# Patient Record
Sex: Female | Born: 1970 | Race: White | Hispanic: No | Smoking: Former smoker
Health system: Southern US, Community
[De-identification: ages and names within clinical notes are randomized; demographics above are authoritative.]

## PROBLEM LIST (undated history)

## (undated) DIAGNOSIS — IMO0002 Reserved for concepts with insufficient information to code with codable children: Secondary | ICD-10-CM

## (undated) DIAGNOSIS — F32A Depression, unspecified: Secondary | ICD-10-CM

## (undated) DIAGNOSIS — J45909 Unspecified asthma, uncomplicated: Secondary | ICD-10-CM

## (undated) DIAGNOSIS — F419 Anxiety disorder, unspecified: Secondary | ICD-10-CM

## (undated) DIAGNOSIS — D649 Anemia, unspecified: Secondary | ICD-10-CM

## (undated) DIAGNOSIS — K219 Gastro-esophageal reflux disease without esophagitis: Secondary | ICD-10-CM

## (undated) DIAGNOSIS — K589 Irritable bowel syndrome without diarrhea: Secondary | ICD-10-CM

## (undated) DIAGNOSIS — K760 Fatty (change of) liver, not elsewhere classified: Secondary | ICD-10-CM

## (undated) DIAGNOSIS — F329 Major depressive disorder, single episode, unspecified: Secondary | ICD-10-CM

## (undated) HISTORY — PX: PLANTAR FASCIA RELEASE: SHX2239

## (undated) HISTORY — PX: WISDOM TOOTH EXTRACTION: SHX21

## (undated) HISTORY — DX: Fatty (change of) liver, not elsewhere classified: K76.0

## (undated) HISTORY — PX: DILATION AND CURETTAGE OF UTERUS: SHX78

## (undated) HISTORY — DX: Gastro-esophageal reflux disease without esophagitis: K21.9

## (undated) HISTORY — DX: Irritable bowel syndrome, unspecified: K58.9

## (undated) HISTORY — DX: Major depressive disorder, single episode, unspecified: F32.9

## (undated) HISTORY — DX: Reserved for concepts with insufficient information to code with codable children: IMO0002

## (undated) HISTORY — DX: Unspecified asthma, uncomplicated: J45.909

## (undated) HISTORY — DX: Depression, unspecified: F32.A

## (undated) HISTORY — PX: CHOLECYSTECTOMY: SHX55

---

## 2000-05-16 ENCOUNTER — Other Ambulatory Visit (HOSPITAL_COMMUNITY): Admission: RE | Admit: 2000-05-16 | Discharge: 2000-05-28 | Payer: Self-pay | Admitting: Psychiatry

## 2002-09-19 ENCOUNTER — Other Ambulatory Visit: Admission: RE | Admit: 2002-09-19 | Discharge: 2002-09-19 | Payer: Self-pay | Admitting: Obstetrics and Gynecology

## 2003-03-11 ENCOUNTER — Encounter: Admission: RE | Admit: 2003-03-11 | Discharge: 2003-03-11 | Payer: Self-pay

## 2003-04-03 ENCOUNTER — Encounter: Admission: RE | Admit: 2003-04-03 | Discharge: 2003-04-03 | Payer: Self-pay | Admitting: Internal Medicine

## 2003-11-06 ENCOUNTER — Other Ambulatory Visit: Admission: RE | Admit: 2003-11-06 | Discharge: 2003-11-06 | Payer: Self-pay | Admitting: Obstetrics and Gynecology

## 2003-11-18 ENCOUNTER — Ambulatory Visit (HOSPITAL_COMMUNITY): Admission: RE | Admit: 2003-11-18 | Discharge: 2003-11-18 | Payer: Self-pay | Admitting: Obstetrics and Gynecology

## 2005-04-26 ENCOUNTER — Other Ambulatory Visit: Admission: RE | Admit: 2005-04-26 | Discharge: 2005-04-26 | Payer: Self-pay | Admitting: Gynecology

## 2005-12-31 ENCOUNTER — Ambulatory Visit: Payer: Self-pay | Admitting: Critical Care Medicine

## 2005-12-31 ENCOUNTER — Inpatient Hospital Stay (HOSPITAL_COMMUNITY): Admission: AD | Admit: 2005-12-31 | Discharge: 2006-01-05 | Payer: Self-pay | Admitting: Obstetrics and Gynecology

## 2006-01-02 ENCOUNTER — Encounter (INDEPENDENT_AMBULATORY_CARE_PROVIDER_SITE_OTHER): Payer: Self-pay | Admitting: *Deleted

## 2006-01-02 ENCOUNTER — Encounter: Payer: Self-pay | Admitting: Critical Care Medicine

## 2006-01-04 ENCOUNTER — Ambulatory Visit: Payer: Self-pay | Admitting: Internal Medicine

## 2006-01-06 ENCOUNTER — Encounter: Admission: RE | Admit: 2006-01-06 | Discharge: 2006-02-04 | Payer: Self-pay | Admitting: Obstetrics and Gynecology

## 2006-02-05 ENCOUNTER — Encounter: Admission: RE | Admit: 2006-02-05 | Discharge: 2006-03-07 | Payer: Self-pay | Admitting: Obstetrics and Gynecology

## 2006-02-20 ENCOUNTER — Ambulatory Visit: Payer: Self-pay | Admitting: Internal Medicine

## 2006-03-08 ENCOUNTER — Encounter: Admission: RE | Admit: 2006-03-08 | Discharge: 2006-04-06 | Payer: Self-pay | Admitting: Obstetrics and Gynecology

## 2006-03-13 ENCOUNTER — Ambulatory Visit: Payer: Self-pay | Admitting: Internal Medicine

## 2006-04-07 ENCOUNTER — Encounter: Admission: RE | Admit: 2006-04-07 | Discharge: 2006-05-07 | Payer: Self-pay | Admitting: Obstetrics and Gynecology

## 2006-05-08 ENCOUNTER — Encounter: Admission: RE | Admit: 2006-05-08 | Discharge: 2006-06-07 | Payer: Self-pay | Admitting: Obstetrics and Gynecology

## 2006-06-08 ENCOUNTER — Encounter: Admission: RE | Admit: 2006-06-08 | Discharge: 2006-07-06 | Payer: Self-pay | Admitting: Obstetrics and Gynecology

## 2006-07-07 ENCOUNTER — Encounter: Admission: RE | Admit: 2006-07-07 | Discharge: 2006-08-06 | Payer: Self-pay | Admitting: Obstetrics and Gynecology

## 2006-08-07 ENCOUNTER — Encounter: Admission: RE | Admit: 2006-08-07 | Discharge: 2006-09-05 | Payer: Self-pay | Admitting: Obstetrics and Gynecology

## 2006-09-06 ENCOUNTER — Encounter: Admission: RE | Admit: 2006-09-06 | Discharge: 2006-10-06 | Payer: Self-pay | Admitting: Obstetrics and Gynecology

## 2006-10-07 ENCOUNTER — Encounter: Admission: RE | Admit: 2006-10-07 | Discharge: 2006-11-05 | Payer: Self-pay | Admitting: Obstetrics and Gynecology

## 2006-11-06 ENCOUNTER — Encounter: Admission: RE | Admit: 2006-11-06 | Discharge: 2006-12-06 | Payer: Self-pay | Admitting: Obstetrics and Gynecology

## 2006-12-07 ENCOUNTER — Encounter: Admission: RE | Admit: 2006-12-07 | Discharge: 2007-01-06 | Payer: Self-pay | Admitting: Obstetrics and Gynecology

## 2007-01-07 ENCOUNTER — Encounter: Admission: RE | Admit: 2007-01-07 | Discharge: 2007-01-31 | Payer: Self-pay | Admitting: Obstetrics and Gynecology

## 2007-06-10 ENCOUNTER — Emergency Department (HOSPITAL_COMMUNITY): Admission: EM | Admit: 2007-06-10 | Discharge: 2007-06-10 | Payer: Self-pay | Admitting: Emergency Medicine

## 2007-08-20 ENCOUNTER — Ambulatory Visit (HOSPITAL_COMMUNITY): Admission: RE | Admit: 2007-08-20 | Discharge: 2007-08-20 | Payer: Self-pay | Admitting: Gynecology

## 2008-01-28 ENCOUNTER — Telehealth: Payer: Self-pay | Admitting: Internal Medicine

## 2008-02-01 ENCOUNTER — Inpatient Hospital Stay (HOSPITAL_COMMUNITY): Admission: AD | Admit: 2008-02-01 | Discharge: 2008-02-01 | Payer: Self-pay | Admitting: Obstetrics and Gynecology

## 2008-03-12 ENCOUNTER — Encounter: Payer: Self-pay | Admitting: Internal Medicine

## 2008-03-12 DIAGNOSIS — IMO0002 Reserved for concepts with insufficient information to code with codable children: Secondary | ICD-10-CM

## 2008-03-12 DIAGNOSIS — Z8659 Personal history of other mental and behavioral disorders: Secondary | ICD-10-CM

## 2008-03-12 DIAGNOSIS — K219 Gastro-esophageal reflux disease without esophagitis: Secondary | ICD-10-CM | POA: Insufficient documentation

## 2008-03-12 DIAGNOSIS — K760 Fatty (change of) liver, not elsewhere classified: Secondary | ICD-10-CM

## 2008-03-16 ENCOUNTER — Ambulatory Visit: Payer: Self-pay | Admitting: Internal Medicine

## 2008-03-23 ENCOUNTER — Telehealth: Payer: Self-pay | Admitting: Internal Medicine

## 2008-04-15 ENCOUNTER — Ambulatory Visit (HOSPITAL_COMMUNITY): Admission: RE | Admit: 2008-04-15 | Discharge: 2008-04-15 | Payer: Self-pay | Admitting: Internal Medicine

## 2008-04-15 ENCOUNTER — Ambulatory Visit: Payer: Self-pay | Admitting: Internal Medicine

## 2008-04-15 ENCOUNTER — Telehealth: Payer: Self-pay | Admitting: Internal Medicine

## 2008-04-15 DIAGNOSIS — R109 Unspecified abdominal pain: Secondary | ICD-10-CM | POA: Insufficient documentation

## 2008-04-15 LAB — CONVERTED CEMR LAB
ALT: 8 units/L (ref 0–35)
AST: 15 units/L (ref 0–37)
Albumin: 3 g/dL — ABNORMAL LOW (ref 3.5–5.2)
Alkaline Phosphatase: 49 units/L (ref 39–117)
Amylase: 75 units/L (ref 27–131)
BUN: 6 mg/dL (ref 6–23)
Basophils Absolute: 0.1 10*3/uL (ref 0.0–0.1)
Basophils Relative: 0.6 % (ref 0.0–3.0)
CO2: 25 meq/L (ref 19–32)
Calcium: 8.6 mg/dL (ref 8.4–10.5)
Chloride: 106 meq/L (ref 96–112)
Creatinine, Ser: 0.5 mg/dL (ref 0.4–1.2)
Eosinophils Absolute: 0.1 10*3/uL (ref 0.0–0.7)
Eosinophils Relative: 1.3 % (ref 0.0–5.0)
GFR calc Af Amer: 180 mL/min
GFR calc non Af Amer: 148 mL/min
Glucose, Bld: 83 mg/dL (ref 70–99)
HCT: 32.5 % — ABNORMAL LOW (ref 36.0–46.0)
Hemoglobin: 11.1 g/dL — ABNORMAL LOW (ref 12.0–15.0)
Lipase: 36 units/L (ref 11.0–59.0)
Lymphocytes Relative: 13.8 % (ref 12.0–46.0)
MCHC: 34.7 g/dL (ref 30.0–36.0)
MCV: 84.4 fL (ref 78.0–100.0)
Monocytes Absolute: 0.8 10*3/uL (ref 0.1–1.0)
Monocytes Relative: 7.4 % (ref 3.0–12.0)
Neutro Abs: 8.8 10*3/uL — ABNORMAL HIGH (ref 1.4–7.7)
Neutrophils Relative %: 76.9 % (ref 43.0–77.0)
Platelets: 300 10*3/uL (ref 150–400)
Potassium: 3.7 meq/L (ref 3.5–5.1)
RBC: 3.85 M/uL — ABNORMAL LOW (ref 3.87–5.11)
RDW: 13 % (ref 11.5–14.6)
Sodium: 138 meq/L (ref 135–145)
Total Bilirubin: 0.5 mg/dL (ref 0.3–1.2)
Total Protein: 6.7 g/dL (ref 6.0–8.3)
WBC: 11.4 10*3/uL — ABNORMAL HIGH (ref 4.5–10.5)

## 2008-05-29 ENCOUNTER — Telehealth: Payer: Self-pay | Admitting: Internal Medicine

## 2008-05-29 ENCOUNTER — Encounter: Payer: Self-pay | Admitting: Internal Medicine

## 2008-08-07 ENCOUNTER — Inpatient Hospital Stay (HOSPITAL_COMMUNITY): Admission: AD | Admit: 2008-08-07 | Discharge: 2008-08-11 | Payer: Self-pay | Admitting: Obstetrics and Gynecology

## 2008-08-24 ENCOUNTER — Encounter (INDEPENDENT_AMBULATORY_CARE_PROVIDER_SITE_OTHER): Payer: Self-pay | Admitting: *Deleted

## 2008-12-07 ENCOUNTER — Encounter: Payer: Self-pay | Admitting: Internal Medicine

## 2009-02-12 ENCOUNTER — Ambulatory Visit: Payer: Self-pay | Admitting: Internal Medicine

## 2010-03-18 ENCOUNTER — Telehealth: Payer: Self-pay | Admitting: Internal Medicine

## 2010-05-03 ENCOUNTER — Ambulatory Visit: Admit: 2010-05-03 | Payer: Self-pay | Admitting: Internal Medicine

## 2010-05-06 ENCOUNTER — Encounter: Payer: Self-pay | Admitting: Nurse Practitioner

## 2010-05-10 ENCOUNTER — Ambulatory Visit
Admission: RE | Admit: 2010-05-10 | Discharge: 2010-05-10 | Payer: Self-pay | Source: Home / Self Care | Attending: Gastroenterology | Admitting: Gastroenterology

## 2010-05-10 ENCOUNTER — Encounter: Payer: Self-pay | Admitting: Internal Medicine

## 2010-05-10 ENCOUNTER — Telehealth: Payer: Self-pay | Admitting: Internal Medicine

## 2010-05-10 ENCOUNTER — Other Ambulatory Visit: Payer: Self-pay | Admitting: Nurse Practitioner

## 2010-05-10 DIAGNOSIS — R1013 Epigastric pain: Secondary | ICD-10-CM | POA: Insufficient documentation

## 2010-05-10 DIAGNOSIS — R112 Nausea with vomiting, unspecified: Secondary | ICD-10-CM | POA: Insufficient documentation

## 2010-05-10 LAB — CBC WITH DIFFERENTIAL/PLATELET
Basophils Absolute: 0.1 10*3/uL (ref 0.0–0.1)
Basophils Relative: 1.7 % (ref 0.0–3.0)
Eosinophils Absolute: 0.3 10*3/uL (ref 0.0–0.7)
Eosinophils Relative: 4.8 % (ref 0.0–5.0)
HCT: 36.5 % (ref 36.0–46.0)
Hemoglobin: 12.5 g/dL (ref 12.0–15.0)
Lymphocytes Relative: 31.5 % (ref 12.0–46.0)
Lymphs Abs: 2 10*3/uL (ref 0.7–4.0)
MCHC: 34.2 g/dL (ref 30.0–36.0)
MCV: 82.7 fl (ref 78.0–100.0)
Monocytes Absolute: 0.5 10*3/uL (ref 0.1–1.0)
Monocytes Relative: 8 % (ref 3.0–12.0)
Neutro Abs: 3.4 10*3/uL (ref 1.4–7.7)
Neutrophils Relative %: 54 % (ref 43.0–77.0)
Platelets: 310 10*3/uL (ref 150.0–400.0)
RBC: 4.41 Mil/uL (ref 3.87–5.11)
RDW: 14.2 % (ref 11.5–14.6)
WBC: 6.3 10*3/uL (ref 4.5–10.5)

## 2010-05-10 LAB — LIPASE: Lipase: 31 U/L (ref 11.0–59.0)

## 2010-05-10 LAB — COMPREHENSIVE METABOLIC PANEL
ALT: 8 U/L (ref 0–35)
AST: 15 U/L (ref 0–37)
Albumin: 3.7 g/dL (ref 3.5–5.2)
Alkaline Phosphatase: 60 U/L (ref 39–117)
BUN: 10 mg/dL (ref 6–23)
CO2: 30 mEq/L (ref 19–32)
Calcium: 9 mg/dL (ref 8.4–10.5)
Chloride: 105 mEq/L (ref 96–112)
Creatinine, Ser: 1 mg/dL (ref 0.4–1.2)
GFR: 69.06 mL/min (ref >60.00)
Glucose, Bld: 77 mg/dL (ref 70–99)
Potassium: 4.7 mEq/L (ref 3.5–5.1)
Sodium: 140 mEq/L (ref 135–145)
Total Bilirubin: 0.4 mg/dL (ref 0.3–1.2)
Total Protein: 7.3 g/dL (ref 6.0–8.3)

## 2010-05-10 LAB — AMYLASE: Amylase: 38 U/L (ref 27–131)

## 2010-05-11 ENCOUNTER — Telehealth: Payer: Self-pay | Admitting: Internal Medicine

## 2010-05-12 ENCOUNTER — Telehealth: Payer: Self-pay | Admitting: Internal Medicine

## 2010-05-12 ENCOUNTER — Emergency Department (HOSPITAL_COMMUNITY)
Admission: EM | Admit: 2010-05-12 | Discharge: 2010-05-13 | Payer: Self-pay | Source: Home / Self Care | Admitting: Emergency Medicine

## 2010-05-13 ENCOUNTER — Encounter (INDEPENDENT_AMBULATORY_CARE_PROVIDER_SITE_OTHER): Payer: Self-pay | Admitting: *Deleted

## 2010-05-13 ENCOUNTER — Telehealth: Payer: Self-pay | Admitting: Internal Medicine

## 2010-05-16 ENCOUNTER — Ambulatory Visit: Admit: 2010-05-16 | Payer: Self-pay | Admitting: Internal Medicine

## 2010-05-16 LAB — COMPREHENSIVE METABOLIC PANEL
ALT: 9 U/L (ref 0–35)
AST: 18 U/L (ref 0–37)
Albumin: 3.6 g/dL (ref 3.5–5.2)
Alkaline Phosphatase: 63 U/L (ref 39–117)
BUN: 9 mg/dL (ref 6–23)
CO2: 27 mEq/L (ref 19–32)
Calcium: 8.9 mg/dL (ref 8.4–10.5)
Chloride: 105 mEq/L (ref 96–112)
Creatinine, Ser: 1 mg/dL (ref 0.4–1.2)
GFR calc Af Amer: >60 mL/min (ref >60)
GFR calc non Af Amer: >60 mL/min (ref >60)
Glucose, Bld: 97 mg/dL (ref 70–99)
Potassium: 3.5 mEq/L (ref 3.5–5.1)
Sodium: 137 mEq/L (ref 135–145)
Total Bilirubin: 0.3 mg/dL (ref 0.3–1.2)
Total Protein: 7 g/dL (ref 6.0–8.3)

## 2010-05-16 LAB — DIFFERENTIAL
Basophils Absolute: 0 K/uL (ref 0.0–0.1)
Basophils Relative: 0 % (ref 0–1)
Eosinophils Absolute: 0.3 K/uL (ref 0.0–0.7)
Eosinophils Relative: 4 % (ref 0–5)
Lymphocytes Relative: 33 % (ref 12–46)
Lymphs Abs: 2.6 K/uL (ref 0.7–4.0)
Monocytes Absolute: 0.6 K/uL (ref 0.1–1.0)
Monocytes Relative: 8 % (ref 3–12)
Neutro Abs: 4.3 K/uL (ref 1.7–7.7)
Neutrophils Relative %: 55 % (ref 43–77)

## 2010-05-16 LAB — CBC
HCT: 36.2 % (ref 36.0–46.0)
Hemoglobin: 11.6 g/dL — ABNORMAL LOW (ref 12.0–15.0)
MCH: 27.2 pg (ref 26.0–34.0)
MCHC: 32 g/dL (ref 30.0–36.0)
MCV: 84.8 fL (ref 78.0–100.0)
Platelets: 280 10*3/uL (ref 150–400)
RBC: 4.27 MIL/uL (ref 3.87–5.11)
RDW: 13.5 % (ref 11.5–15.5)
WBC: 7.8 10*3/uL (ref 4.0–10.5)

## 2010-05-16 LAB — URINALYSIS, ROUTINE W REFLEX MICROSCOPIC
Bilirubin Urine: NEGATIVE
Ketones, ur: NEGATIVE mg/dL
Leukocytes, UA: NEGATIVE
Nitrite: NEGATIVE
Protein, ur: NEGATIVE mg/dL
Specific Gravity, Urine: 1.016 (ref 1.005–1.030)
Urine Glucose, Fasting: NEGATIVE mg/dL
Urobilinogen, UA: 0.2 mg/dL (ref 0.0–1.0)
pH: 6 (ref 5.0–8.0)

## 2010-05-16 LAB — URINE MICROSCOPIC-ADD ON

## 2010-05-16 LAB — POCT PREGNANCY, URINE: Preg Test, Ur: NEGATIVE

## 2010-05-16 LAB — LIPASE, BLOOD: Lipase: 28 U/L (ref 11–59)

## 2010-05-17 ENCOUNTER — Other Ambulatory Visit: Payer: Self-pay | Admitting: Internal Medicine

## 2010-05-17 ENCOUNTER — Ambulatory Visit
Admission: RE | Admit: 2010-05-17 | Discharge: 2010-05-17 | Payer: Self-pay | Source: Home / Self Care | Attending: Internal Medicine | Admitting: Internal Medicine

## 2010-05-17 ENCOUNTER — Encounter: Payer: Self-pay | Admitting: Internal Medicine

## 2010-05-17 DIAGNOSIS — R197 Diarrhea, unspecified: Secondary | ICD-10-CM | POA: Insufficient documentation

## 2010-05-17 LAB — HEPATIC FUNCTION PANEL
ALT: 11 U/L (ref 0–35)
AST: 15 U/L (ref 0–37)
Albumin: 3.8 g/dL (ref 3.5–5.2)
Alkaline Phosphatase: 64 U/L (ref 39–117)
Bilirubin, Direct: 0 mg/dL (ref 0.0–0.3)
Total Bilirubin: 0.3 mg/dL (ref 0.3–1.2)
Total Protein: 7.2 g/dL (ref 6.0–8.3)

## 2010-05-17 LAB — LIPASE: Lipase: 22 U/L (ref 11.0–59.0)

## 2010-05-19 ENCOUNTER — Ambulatory Visit: Admit: 2010-05-19 | Payer: Self-pay | Admitting: Internal Medicine

## 2010-05-20 LAB — AMYLASE: Amylase: 32 U/L (ref 27–131)

## 2010-05-22 ENCOUNTER — Encounter: Payer: Self-pay | Admitting: Obstetrics and Gynecology

## 2010-05-23 ENCOUNTER — Emergency Department (HOSPITAL_COMMUNITY)
Admission: EM | Admit: 2010-05-23 | Discharge: 2010-05-24 | Payer: Self-pay | Source: Home / Self Care | Admitting: Emergency Medicine

## 2010-05-23 ENCOUNTER — Telehealth: Payer: Self-pay | Admitting: Internal Medicine

## 2010-05-23 ENCOUNTER — Ambulatory Visit (HOSPITAL_COMMUNITY)
Admission: RE | Admit: 2010-05-23 | Discharge: 2010-05-23 | Payer: Self-pay | Source: Home / Self Care | Attending: Internal Medicine | Admitting: Internal Medicine

## 2010-05-24 ENCOUNTER — Encounter (INDEPENDENT_AMBULATORY_CARE_PROVIDER_SITE_OTHER): Payer: Self-pay | Admitting: *Deleted

## 2010-05-25 LAB — CBC
HCT: 35.8 % — ABNORMAL LOW (ref 36.0–46.0)
Hemoglobin: 11.3 g/dL — ABNORMAL LOW (ref 12.0–15.0)
MCH: 26.8 pg (ref 26.0–34.0)
MCHC: 31.6 g/dL (ref 30.0–36.0)
MCV: 84.8 fL (ref 78.0–100.0)
Platelets: 281 10*3/uL (ref 150–400)
RBC: 4.22 MIL/uL (ref 3.87–5.11)
RDW: 13.7 % (ref 11.5–15.5)
WBC: 7.1 10*3/uL (ref 4.0–10.5)

## 2010-05-25 LAB — DIFFERENTIAL
Basophils Absolute: 0 10*3/uL (ref 0.0–0.1)
Basophils Relative: 1 % (ref 0–1)
Eosinophils Absolute: 0.3 10*3/uL (ref 0.0–0.7)
Eosinophils Relative: 5 % (ref 0–5)
Lymphocytes Relative: 38 % (ref 12–46)
Lymphs Abs: 2.7 10*3/uL (ref 0.7–4.0)
Monocytes Absolute: 0.6 10*3/uL (ref 0.1–1.0)
Monocytes Relative: 8 % (ref 3–12)
Neutro Abs: 3.5 10*3/uL (ref 1.7–7.7)
Neutrophils Relative %: 49 % (ref 43–77)

## 2010-05-25 LAB — COMPREHENSIVE METABOLIC PANEL
ALT: <8 U/L (ref 0–35)
AST: 16 U/L (ref 0–37)
Albumin: 3.4 g/dL — ABNORMAL LOW (ref 3.5–5.2)
Alkaline Phosphatase: 53 U/L (ref 39–117)
BUN: 11 mg/dL (ref 6–23)
CO2: 24 mEq/L (ref 19–32)
Calcium: 8.6 mg/dL (ref 8.4–10.5)
Chloride: 109 mEq/L (ref 96–112)
Creatinine, Ser: 0.93 mg/dL (ref 0.4–1.2)
GFR calc Af Amer: >60 mL/min (ref >60)
GFR calc non Af Amer: >60 mL/min (ref >60)
Glucose, Bld: 99 mg/dL (ref 70–99)
Potassium: 3.9 mEq/L (ref 3.5–5.1)
Sodium: 141 mEq/L (ref 135–145)
Total Bilirubin: 0.1 mg/dL — ABNORMAL LOW (ref 0.3–1.2)
Total Protein: 6.5 g/dL (ref 6.0–8.3)

## 2010-05-25 LAB — LIPASE, BLOOD: Lipase: 32 U/L (ref 11–59)

## 2010-05-27 ENCOUNTER — Encounter: Payer: Self-pay | Admitting: Internal Medicine

## 2010-05-27 ENCOUNTER — Ambulatory Visit
Admission: RE | Admit: 2010-05-27 | Discharge: 2010-05-27 | Payer: Self-pay | Source: Home / Self Care | Attending: Internal Medicine | Admitting: Internal Medicine

## 2010-05-29 ENCOUNTER — Telehealth: Payer: Self-pay | Admitting: Internal Medicine

## 2010-05-29 ENCOUNTER — Inpatient Hospital Stay (HOSPITAL_COMMUNITY)
Admission: EM | Admit: 2010-05-29 | Discharge: 2010-06-05 | DRG: 493 | Disposition: A | Discharge status: Home / Self Care | Payer: BC Managed Care – PPO | Attending: Internal Medicine | Admitting: Internal Medicine

## 2010-05-29 DIAGNOSIS — K219 Gastro-esophageal reflux disease without esophagitis: Secondary | ICD-10-CM | POA: Diagnosis present

## 2010-05-29 DIAGNOSIS — F341 Dysthymic disorder: Secondary | ICD-10-CM | POA: Diagnosis present

## 2010-05-29 DIAGNOSIS — K811 Chronic cholecystitis: Principal | ICD-10-CM | POA: Diagnosis present

## 2010-05-29 DIAGNOSIS — K5900 Constipation, unspecified: Secondary | ICD-10-CM | POA: Diagnosis present

## 2010-05-29 DIAGNOSIS — K7689 Other specified diseases of liver: Secondary | ICD-10-CM | POA: Diagnosis present

## 2010-05-29 DIAGNOSIS — Z87891 Personal history of nicotine dependence: Secondary | ICD-10-CM

## 2010-05-29 DIAGNOSIS — E860 Dehydration: Secondary | ICD-10-CM | POA: Diagnosis present

## 2010-05-29 DIAGNOSIS — E785 Hyperlipidemia, unspecified: Secondary | ICD-10-CM | POA: Diagnosis present

## 2010-05-29 LAB — CBC
HCT: 35.3 % — ABNORMAL LOW (ref 36.0–46.0)
Hemoglobin: 11.1 g/dL — ABNORMAL LOW (ref 12.0–15.0)
MCH: 26.6 pg (ref 26.0–34.0)
MCHC: 31.4 g/dL (ref 30.0–36.0)
MCV: 84.7 fL (ref 78.0–100.0)
Platelets: 271 10*3/uL (ref 150–400)
RBC: 4.17 MIL/uL (ref 3.87–5.11)
RDW: 13.3 % (ref 11.5–15.5)
WBC: 8.5 10*3/uL (ref 4.0–10.5)

## 2010-05-29 LAB — COMPREHENSIVE METABOLIC PANEL
ALT: 8 U/L (ref 0–35)
AST: 14 U/L (ref 0–37)
Albumin: 3.4 g/dL — ABNORMAL LOW (ref 3.5–5.2)
Alkaline Phosphatase: 51 U/L (ref 39–117)
BUN: 7 mg/dL (ref 6–23)
CO2: 25 mEq/L (ref 19–32)
Calcium: 8.3 mg/dL — ABNORMAL LOW (ref 8.4–10.5)
Chloride: 107 mEq/L (ref 96–112)
Creatinine, Ser: 0.94 mg/dL (ref 0.4–1.2)
GFR calc Af Amer: >60 mL/min (ref >60)
GFR calc non Af Amer: >60 mL/min (ref >60)
Glucose, Bld: 90 mg/dL (ref 70–99)
Potassium: 4 mEq/L (ref 3.5–5.1)
Sodium: 140 mEq/L (ref 135–145)
Total Bilirubin: 0.3 mg/dL (ref 0.3–1.2)
Total Protein: 6.5 g/dL (ref 6.0–8.3)

## 2010-05-29 LAB — URINE MICROSCOPIC-ADD ON

## 2010-05-29 LAB — URINALYSIS, ROUTINE W REFLEX MICROSCOPIC
Bilirubin Urine: NEGATIVE
Ketones, ur: NEGATIVE mg/dL
Leukocytes, UA: NEGATIVE
Nitrite: NEGATIVE
Protein, ur: NEGATIVE mg/dL
Specific Gravity, Urine: 1.017 (ref 1.005–1.030)
Urine Glucose, Fasting: NEGATIVE mg/dL
Urobilinogen, UA: 0.2 mg/dL (ref 0.0–1.0)
pH: 7 (ref 5.0–8.0)

## 2010-05-29 LAB — DIFFERENTIAL
Basophils Absolute: 0 10*3/uL (ref 0.0–0.1)
Basophils Relative: 0 % (ref 0–1)
Eosinophils Absolute: 0.3 10*3/uL (ref 0.0–0.7)
Eosinophils Relative: 4 % (ref 0–5)
Lymphocytes Relative: 23 % (ref 12–46)
Lymphs Abs: 1.9 10*3/uL (ref 0.7–4.0)
Monocytes Absolute: 0.6 10*3/uL (ref 0.1–1.0)
Monocytes Relative: 7 % (ref 3–12)
Neutro Abs: 5.7 10*3/uL (ref 1.7–7.7)
Neutrophils Relative %: 67 % (ref 43–77)

## 2010-05-29 LAB — LIPASE, BLOOD: Lipase: 37 U/L (ref 11–59)

## 2010-05-29 LAB — POCT PREGNANCY, URINE: Preg Test, Ur: NEGATIVE

## 2010-05-30 ENCOUNTER — Encounter (INDEPENDENT_AMBULATORY_CARE_PROVIDER_SITE_OTHER): Payer: Self-pay | Admitting: *Deleted

## 2010-05-30 ENCOUNTER — Telehealth: Payer: Self-pay | Admitting: Internal Medicine

## 2010-05-30 LAB — COMPREHENSIVE METABOLIC PANEL
Alkaline Phosphatase: 43 U/L (ref 39–117)
BUN: 7 mg/dL (ref 6–23)
Creatinine, Ser: 0.99 mg/dL (ref 0.4–1.2)
Glucose, Bld: 94 mg/dL (ref 70–99)
Potassium: 3.7 mEq/L (ref 3.5–5.1)
Total Protein: 6 g/dL (ref 6.0–8.3)

## 2010-05-30 LAB — LIPID PANEL
Cholesterol: 219 mg/dL — ABNORMAL HIGH (ref 0–200)
LDL Cholesterol: 150 mg/dL — ABNORMAL HIGH (ref 0–99)
Total CHOL/HDL Ratio: 6 RATIO
Total CHOL/HDL Ratio: 5.9 RATIO
VLDL: 27 mg/dL (ref 0–40)

## 2010-05-30 LAB — CBC
HCT: 31.7 % — ABNORMAL LOW (ref 36.0–46.0)
MCH: 26.5 pg (ref 26.0–34.0)
MCHC: 31.5 g/dL (ref 30.0–36.0)
MCV: 84.1 fL (ref 78.0–100.0)
RDW: 13.4 % (ref 11.5–15.5)
WBC: 6.4 10*3/uL (ref 4.0–10.5)

## 2010-05-30 LAB — RAPID URINE DRUG SCREEN, HOSP PERFORMED
Barbiturates: POSITIVE — AB
Cocaine: NOT DETECTED
Opiates: POSITIVE — AB

## 2010-05-31 DIAGNOSIS — R1013 Epigastric pain: Secondary | ICD-10-CM

## 2010-05-31 NOTE — Progress Notes (Signed)
Summary: Nexium refill  Medications Added NEXIUM 40 MG CPDR (ESOMEPRAZOLE MAGNESIUM) Take 1 tablet by mouth two times a day       Phone Note Call from Patient   Caller: Pt walked in Call For: CMA Summary of Call: Pt walked in and wanted to know if she could have a refill of her Nexium. Pt states she takes Nexium one tablet by mouth once daily unless she is having a really bad reflux day and she has to take it two times a day. Pt did schedule a appt for 05/03/10 to see Dr. Juanda Chance b/c she is due for her follow- up appt. Pt was given samples of Nexium today and one refill until her appt. Told pt take the Nexium one capsule by mouth twice a day like Dr. Juanda Chance prescribed.  Pt informed in the future to call the office for further refills and questions about her medicines instead of walking in the clinic and pt verbalized understanding.  Initial call taken by: Christie Nottingham CMA (AAMA),  March 18, 2010 10:04 AM    New/Updated Medications: NEXIUM 40 MG CPDR (ESOMEPRAZOLE MAGNESIUM) Take 1 tablet by mouth two times a day Prescriptions: NEXIUM 40 MG CPDR (ESOMEPRAZOLE MAGNESIUM) Take 1 tablet by mouth two times a day  #60 x 0   Entered by:   Christie Nottingham CMA (AAMA)   Authorized by:   Hart Carwin MD   Signed by:   Christie Nottingham CMA (AAMA) on 03/18/2010   Method used:   Electronically to        [facility]* (retail)       [address]       [address] Shipping/mailing       [place], [place]  [address]       Ph: [phone]       Fax: [phone]   RxID:   [id number]

## 2010-06-01 ENCOUNTER — Encounter: Payer: Self-pay | Admitting: Internal Medicine

## 2010-06-01 ENCOUNTER — Encounter: Payer: Self-pay | Admitting: Gastroenterology

## 2010-06-01 DIAGNOSIS — R109 Unspecified abdominal pain: Secondary | ICD-10-CM

## 2010-06-02 DIAGNOSIS — R1013 Epigastric pain: Secondary | ICD-10-CM
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
## 2010-06-02 NOTE — Progress Notes (Signed)
Summary: Triage   Phone Note Call from Patient Call back at Work Phone [phone] Call back at [phone]   Caller: Patient Call For: Dr. Juanda Chance Reason for Call: Talk to Nurse Summary of Call: abd pain, vomiting, bloated.Marland KitchenMarland KitchenWent to PCP on Friday and was told it could possibly be her gallbladder. Requesting sooner appt. than 05-25-10 Initial call taken by: Karna Christmas,  May 10, 2010 10:14 AM  Follow-up for Phone Call        Patient states that starting last Wednesday, she had nausea, vomiting, abdominal pain and bloating. She went to her PCP on Friday. PCP wasn't sure if she had a virus or this was her gallbladder. PCP gave her Phenergan. Continues to have nausea and has vomiting if she doesn't take Phenergan. Drinking fluids but unable to eat. Pain is upper center of stomach above belly button. Feels bloated. Has appointment on 05/25/10 for annual f/u but wants to come in to be seen for above issues. Scheduled patient with Willette Cluster, RNP today at 1:30 PM. Follow-up by: Jesse Fall RN,  May 10, 2010 10:50 AM  Additional Follow-up for Phone Call Additional follow up Details #1::        reviewed and agree. Additional Follow-up by: Hart Carwin MD,  May 10, 2010 7:59 PM

## 2010-06-02 NOTE — Miscellaneous (Signed)
Summary: Librax Rx  Clinical Lists Changes  Medications: Added new medication of LIBRAX 2.5-5 MG  CAPS (CLIDINIUM-CHLORDIAZEPOXIDE) Take one tablet twice a day. - Signed Rx of LIBRAX 2.5-5 MG  CAPS (CLIDINIUM-CHLORDIAZEPOXIDE) Take one tablet twice a day.;  #30 x 0;  Signed;  Entered by: Durwin Glaze RN;  Authorized by: Hart Carwin MD;  Method used: [facility] [address] [phone]*, [address] [place], [place], [place]  [address], Ph: [phone] or [phone], Fax: [phone]    Prescriptions: LIBRAX 2.5-5 MG  CAPS (CLIDINIUM-CHLORDIAZEPOXIDE) Take one tablet twice a day.  #30 x 0   Entered by:   Durwin Glaze RN   Authorized by:   Hart Carwin MD   Signed by:   Durwin Glaze RN on 05/17/2010   Method used:   [facility]        [facility]  [address] [phone]* (retail)       [address]       [place], [place]  [address]       Ph: [phone] or [phone]       Fax: [phone]   RxID:   [id number]

## 2010-06-02 NOTE — Progress Notes (Signed)
Summary: triage / abdominal pain   Phone Note Call from Patient Call back at [phone]   Caller: Patient Call For: Dr Lynder Parents Reason for Call: Talk to Nurse Summary of Call: Patient states that she was seen yesterday for stomach pain but today her stomach pain is worse, wants to know if she can be given something for pain. Initial call taken by: Tawni Levy,  May 11, 2010 1:58 PM  Follow-up for Phone Call        [facility] yesterday. Pain is worse today in upper part of stomach. Pain is in center of chest below breasts. Patient describes the pain as sharp, stabbing pain. Yesterday the pain was more general. Taking Butalbital-Apap every 4 hours not to exceed 6/day as prescribed by PCP. Denies vomiting. Has nausea without vomiting. Diarrhea x 1 today. took Imodium with relief. Taking Nexium two times a day and has EGD scheduled for 05/24/10. She wants something for pain. Please, advise. Follow-up by: Jesse Fall RN,  May 11, 2010 3:42 PM  Additional Follow-up for Phone Call Additional follow up Details #1::        review of chart suggests that the patient has had this problem recurrently without obvious cause found. Currently on b.i.d. PPI. Plans for endoscopy noted. Recent ER visit noted. Cannot recommend any change in plans over the phone based on my review of record. If her symptoms were to come incapacitating, she should probably go to the ER for reevaluation. Otherwise, you can check with Dr. Juanda Chance in the morning, when she returns. Additional Follow-up by: Hilarie Fredrickson MD,  May 11, 2010 4:42 PM    Additional Follow-up for Phone Call Additional follow up Details #2::    Patient aware of Dr. Lamar Sprinkles recommendations. Told her I will have Dr. Juanda Chance review in AM also.  Follow-up by: Jesse Fall RN,  May 11, 2010 4:45 PM  Additional Follow-up for Phone Call Additional follow up Details #3:: Details for Additional Follow-up Action Taken: Please set up an  appointment. Additional Follow-up by: Hart Carwin MD,  May 11, 2010 8:29 PM   Appended Document: triage / abdominal pain Appointment with Dr. Juanda Chance scheduled on 05/16/10 9:30 AM. patient aware.

## 2010-06-02 NOTE — Letter (Signed)
Summary: [facility] Instructions  [facility]  [address] [place], [place] [address]   Phone: [phone]  Fax: [phone]       Sabrina Welch    06/11/1970    MRN: [id number]        Procedure Day /Date: 06/07/10 Tuesday     Arrival Time: 8:00 am     Procedure Time: 9:00 am     Location of Procedure:                    _ x_  [facility] ( Outpatient Registration)                      PREPARATION FOR COLONOSCOPY WITH MOVIPREP   Starting 5 days prior to your procedure 06/02/10 do not eat nuts, seeds, popcorn, corn, beans, peas,  salads, or any raw vegetables.  Do not take any fiber supplements (e.g. Metamucil, Citrucel, and Benefiber).  THE DAY BEFORE YOUR PROCEDURE         DATE: 06/06/10  DAY: Monday  1.  Drink clear liquids the entire day-NO SOLID FOOD  2.  Do not drink anything colored red or purple.  Avoid juices with pulp.  No orange juice.  3.  Drink at least 64 oz. (8 glasses) of fluid/clear liquids during the day to prevent dehydration and help the prep work efficiently.  CLEAR LIQUIDS INCLUDE: Water Jello Ice Popsicles Tea (sugar ok, no milk/cream) Powdered fruit flavored drinks Coffee (sugar ok, no milk/cream) Gatorade Juice: apple, white grape, white cranberry  Lemonade Clear bullion, consomm, broth Carbonated beverages (any kind) Strained chicken noodle soup Hard Candy                             4.  In the morning, mix first dose of MoviPrep solution:    Empty 1 Pouch A and 1 Pouch B into the disposable container    Add lukewarm drinking water to the top line of the container. Mix to dissolve    Refrigerate (mixed solution should be used within 24 hrs)  5.  Begin drinking the prep at 5:00 p.m. The MoviPrep container is divided by 4 marks.   Every 15 minutes drink the solution down to the next mark (approximately 8 oz) until the full liter is complete.   6.  Follow completed prep with 16 oz of clear liquid of your choice  (Nothing red or purple).  Continue to drink clear liquids until bedtime.  7.  Before going to bed, mix second dose of MoviPrep solution:    Empty 1 Pouch A and 1 Pouch B into the disposable container    Add lukewarm drinking water to the top line of the container. Mix to dissolve    Refrigerate  THE DAY OF YOUR PROCEDURE      DATE: 06/07/10 DAY: Tuesday  Beginning at 4:00 a.m. (5 hours before procedure):         1. Every 15 minutes, drink the solution down to the next mark (approx 8 oz) until the full liter is complete.  2. Follow completed prep with 16 oz. of clear liquid of your choice.    3. You may drink clear liquids until 5:00 am (4 HOURS BEFORE PROCEDURE).   MEDICATION INSTRUCTIONS  Unless otherwise instructed, you should take regular prescription medications with a small sip of water   as early as possible the morning of your  procedure.       OTHER INSTRUCTIONS  You will need a responsible adult at least 39 years of age to accompany you and drive you home.   This person must remain in the waiting room during your procedure.  Wear loose fitting clothing that is easily removed.  Leave jewelry and other valuables at home.  However, you may wish to bring a book to read or  an iPod/MP3 player to listen to music as you wait for your procedure to start.  Remove all body piercing jewelry and leave at home.  Total time from sign-in until discharge is approximately 2-3 hours.  You should go home directly after your procedure and rest.  You can resume normal activities the  day after your procedure.  The day of your procedure you should not:   Drive   Make legal decisions   Operate machinery   Drink alcohol   Return to work  You will receive specific instructions about eating, activities and medications before you leave.    The above instructions have been reviewed and explained to me by   _______________________    I fully understand and can verbalize  these instructions _____________________________ Date _________

## 2010-06-02 NOTE — Progress Notes (Signed)
Summary: triage  Medications Added PROMETHAZINE HCL 25 MG TABS (PROMETHAZINE HCL) Take one by mouth every 4-6 hours as needed nausea, vomiting DONNATAL  TABS (BELLADONNA ALK-PHENOBARBITAL) Take one by mouth three times a day [facility])       Phone Note Call from Patient Call back at Home Phone [phone] Call back at [phone]   Caller: Patient Call For: Dr Juanda Chance Reason for Call: Talk to Nurse Summary of Call: Patient states that she had Hida Scan thia am and still has a lot of pain, vomitting and she's not sure of what to do. Initial call taken by: Tawni Levy,  May 23, 2010 1:32 PM  Follow-up for Phone Call        Spoke with patient. She states she had the HIDA scan this AM. She is still having the abdominal pain and is having to take Vicodan. Elio Forget is not helping. . She is using the Librax two times a day also. States she is having vomiting also and she is out of the Phenergan 25 mg tabs that the ER gave her. Do you want to order more Phenergan? Schedule f/u? Please, advise.  Follow-up by: Jesse Fall RN,  May 23, 2010 3:03 PM  Additional Follow-up for Phone Call Additional follow up Details #1::        please refill Phenergan 25mg , #30, 1 by mouth q 44-6 hrs, 1 refill. Please schedule CT scan of the abd and Pelvis oral and IV contrast. Additional Follow-up by: Hart Carwin MD,  May 23, 2010 5:49 PM    Additional Follow-up for Phone Call Additional follow up Details #2::    Spoke with patient. After she spoke with Dr. Juanda Chance last PM, her pain got worse and she went to the ER. She had A CT scan of abd, pelvis(copied to EMR) which was normal. She received IVF, IV pain med and IV nausea meds. She was told to contact Dr. Juanda Chance for further evaluation. Rx for Phenergan sent to patients pharmacy. Please, advise for further instructions. Follow-up by: Jesse Fall RN,  May 24, 2010 8:32 AM  Additional Follow-up for Phone Call Additional follow up Details  #3:: Details for Additional Follow-up Action Taken: please give her a trial of Donnatal 1 by mouth three times a day ( ac),, #60, 1 refill and set up an appointment. her CT scan was normal. Additional Follow-up by: Hart Carwin MD,  May 24, 2010 8:35 AM  New/Updated Medications: PROMETHAZINE HCL 25 MG TABS (PROMETHAZINE HCL) Take one by mouth every 4-6 hours as needed nausea, vomiting DONNATAL  TABS (BELLADONNA ALK-PHENOBARBITAL) Take one by mouth three times a day [facility]) Prescriptions: DONNATAL  TABS (BELLADONNA ALK-PHENOBARBITAL) Take one by mouth three times a day (AC)  #60 x 1   Entered by:   Jesse Fall RN   Authorized by:   Hart Carwin MD   Signed by:   Jesse Fall RN on 05/24/2010   Method used:   [facility]        [facility] [address] [phone]* (retail)       [address]       [place], [place]  [address]       Ph: [phone] or [phone]       Fax: [phone]   RxID:   [id number] PROMETHAZINE HCL 25 MG TABS (PROMETHAZINE HCL) Take one by mouth every 4-6 hours as needed nausea, vomiting  #30 x 1  Entered by:   Jesse Fall RN   Authorized by:   Hart Carwin MD   Signed by:   Jesse Fall RN on 05/24/2010   Method used:   Electronically to        [facility] [phone]* (retail)       [address]       [place], [place]  [address]       Ph: [phone] or [phone]       Fax: [phone]   RxID:   [id number]   Appended Document: triage Spoke with patient. Rx for Donnatal sent to pharmacy. She will stop Librax. Appointment given for 05/27/10 2:45 PM with Dr. Juanda Chance.

## 2010-06-02 NOTE — Progress Notes (Signed)
Summary: Still having pain  Medications Added VICODIN 5-500 MG TABS (HYDROCODONE-ACETAMINOPHEN) Take one tablet every 4-6 hours as needed pain       Phone Note Call from Patient Call back at [phone]    Call For: Dr Juanda Chance Summary of Call: Still waiting for call back. Still having pain Initial call taken by: Leanor Kail [facility],  May 12, 2010 1:05 PM  Follow-up for Phone Call        Spoke with patient. She is having increased abdominal pain. Sharp,stabbing pain in upper part of stomach. Per Mike Gip, PA Vicodan 5/500 1 tab every 4-6 hours as needed pain and schedule upper abdominal ultrasound tomorrow. Keep Monday appointment with Dr. Juanda Chance. Patient can go to ER if pain is unbearable. Ultrasound scheduled with Felicia at [facility] 05/13/10 at [facility]. NPO 6 hours prior. Patient scheduled with Dr. Juanda Chance on 05/16/10 at 9:30 AM. Rx for Vicodan to pharmacy.    New/Updated Medications: VICODIN 5-500 MG TABS (HYDROCODONE-ACETAMINOPHEN) Take one tablet every 4-6 hours as needed pain Prescriptions: VICODIN 5-500 MG TABS (HYDROCODONE-ACETAMINOPHEN) Take one tablet every 4-6 hours as needed pain  #30 x 0   Entered by:   Jesse Fall RN   Authorized by:   Sammuel Cooper PA-c   Signed by:   Jesse Fall RN on 05/12/2010   Method used:   Printed then faxed to ...       [facility]* (retail)       [address]       [address] Shipping/mailing       [place], [place]  [address]       Ph: [phone]       Fax: [phone]   RxID:   [id number]   Appended Document: Still having pain Patient notified of appointments and Amy Esterwood's recommendations.   Clinical Lists Changes  Orders: Added new Test order of Ultrasound Abdomen (UAS) - Signed

## 2010-06-02 NOTE — Letter (Signed)
Summary: EGD Instructions  [facility]  [address] [place], [place] [address]   Phone: [phone]  Fax: [phone]       Sabrina Welch    1970/10/15    MRN: [id number]       Procedure Day /Date: 05-19-2010     Arrival Time: 3:00 PM      Procedure Time: 4:00 PM      Location of Procedure:                    X     [facility] (4th Floor)  PREPARATION FOR ENDOSCOPY   On 05-19-2010  THE DAY OF THE PROCEDURE:  1.   No solid foods, milk or milk products are allowed after midnight the night before your procedure.  2.   Do not drink anything colored red or purple.  Avoid juices with pulp.  No orange juice.  3.  You may drink clear liquids until 2:00 PM , which is 2 hours before your procedure.                                                                                                CLEAR LIQUIDS INCLUDE: Water Jello Ice Popsicles Tea (sugar ok, no milk/cream) Powdered fruit flavored drinks Coffee (sugar ok, no milk/cream) Gatorade Juice: apple, white grape, white cranberry  Lemonade Clear bullion, consomm, broth Carbonated beverages (any kind) Strained chicken noodle soup Hard Candy   MEDICATION INSTRUCTIONS  Unless otherwise instructed, you should take regular prescription medications with a small sip of water as early as possible the morning of your procedure.        OTHER INSTRUCTIONS  You will need a responsible adult at least 40 years of age to accompany you and drive you home.   This person must remain in the waiting room during your procedure.  Wear loose fitting clothing that is easily removed.  Leave jewelry and other valuables at home.  However, you may wish to bring a book to read or an iPod/MP3 player to listen to music as you wait for your procedure to start.  Remove all body piercing jewelry and leave at home.  Total time from sign-in until discharge is approximately 2-3 hours.  You should go home directly after  your procedure and rest.  You can resume normal activities the day after your procedure.  The day of your procedure you should not:   Drive   Make legal decisions   Operate machinery   Drink alcohol   Return to work  You will receive specific instructions about eating, activities and medications before you leave.    The above instructions have been reviewed and explained to me by   _______________________    I fully understand and can verbalize these instructions _____________________________ Date _________

## 2010-06-02 NOTE — Procedures (Addendum)
Summary: Upper Endoscopy  Patient: [name] Note: All result statuses are Final unless otherwise noted.  Tests: (1) Upper Endoscopy (EGD)   EGD Upper Endoscopy       DONE     [facility]     [address]     [place], [place]  [address]           ENDOSCOPY PROCEDURE REPORT           PATIENT:  Sabrina Welch, Sabrina Welch  MR#:  [id number]     BIRTHDATE:  [birth year], 39 yrs. old  GENDER:  female           ENDOSCOPIST:  Hedwig Morton. Juanda Chance, MD     Referred by:  Kirby Funk, M.D.           PROCEDURE DATE:  05/17/2010     PROCEDURE:  EGD with biopsy, [address]     ASA CLASS:  Class I     INDICATIONS:  nausea and vomiting, abdominal pain hx of acute     fatty liver of pregnancy vs biliary disease 12/2005, recurrent pain     2009, Thickened gall bladder on sono 2007 but not in 2009 or 2012.     Lipase elevated in 2007, but not last week.           MEDICATIONS:   Versed 10 mg, Fentanyl 75 mcg     TOPICAL ANESTHETIC:  Exactacain Spray           DESCRIPTION OF PROCEDURE:   After the risks benefits and     alternatives of the procedure were thoroughly explained, informed     consent was obtained.  The LB GIF-H180 [id number] endoscope was     introduced through the mouth and advanced to the second portion of     the duodenum, without limitations.  The instrument was slowly     withdrawn as the mucosa was fully examined.     <<PROCEDUREIMAGES>>           The upper, middle, and distal third of the esophagus were     carefully inspected and no abnormalities were noted. The z-line     was well seen at the GEJ. The endoscope was pushed into the fundus     which was normal including a retroflexed view. The antrum,gastric     body, first and second part of the duodenum were unremarkable.     With standard forceps, a biopsy was obtained and sent to pathology     (see image1, image2, image3, image4, and image5). small bowl     biopsy because of microcytic anemia    Retroflexed views revealed     no  abnormalities.    The scope was then withdrawn from the patient     and the procedure completed.           COMPLICATIONS:  None           ENDOSCOPIC IMPRESSION:     1) Normal EGD     s/p small bowl biopsies     RECOMMENDATIONS:     1) Await biopsy results     HIDA scan with CCK     LFT's, amy, lipase today     Librazx 1 po bid, #30,no refill           REPEAT EXAM:  In 0 year(s) for.           ______________________________     Hedwig Morton. Juanda Chance, MD  CC:  Kirby Funk, M.D.           n.     eSIGNED:   Hedwig Morton. Alette Kataoka at 05/17/2010 09:52 AM           Lottie Rater, [id number]  Note: An exclamation mark (!) indicates a result that was not dispersed into the flowsheet. Document Creation Date: 05/17/2010 9:53 AM _______________________________________________________________________  (1) Order result status: Final Collection or observation date-time: 05/17/2010 09:34 Requested date-time:  Receipt date-time:  Reported date-time:  Referring Physician:   Ordering Physician: Lina Sar [phone]) Specimen Source:  Source: Launa Grill Order Number: [id number] Lab site:   Appended Document: Upper Endoscopy    Clinical Lists Changes  Orders: Added new Referral order of HIDA CCK (HIDA CCK) - Signed      Appended Document: Upper Endoscopy Appointment for HIDA with CCk 05/26/10 10:00 AM at [facility] radiology. NPO after midnight. No pain meds. 12 hours prior. Information gvien to recovery room nurse for patient.

## 2010-06-02 NOTE — Letter (Signed)
Summary: [facility]  [facility]   Imported By: Sherian Rein 05/24/2010 12:17:26  _____________________________________________________________________  External Attachment:    Type:   Image     Comment:   External Document

## 2010-06-02 NOTE — Progress Notes (Signed)
Summary: Pt was in ER all night - 8hrs total   Phone Note Call from Patient Call back at Home Phone [phone]   Call For: Dr Juanda Chance Reason for Call: Talk to Nurse Summary of Call: Micah Flesher to ER at [facility] last night and had some stuff done including an ultrasound. Was told to follow up with Dr Juanda Chance today and see if she should follow up sooner than Monday and if she should have her Endo sooner than the 24th.  Initial call taken by: Leanor Kail [facility],  May 13, 2010 8:50 AM  Follow-up for Phone Call        Spoke with patient. She went to ER last PM.(records in EMR) She had an ultrasound of abd that did not show any "stones" and abd xray. They gave her Dilaudid and Zofran which helped her pain and nausea. She is on clear liquids for 24 hours. She is sleepy this AM but is still having nausea and pain all across her stomach and back. She has Phenegan for nausea and Percocet for pain. She is scheduled to see Dr. Juanda Chance on 05/16/10. EGD scheduled for 05/24/10. Patient wants to know if she needs to be seen today( 9 on our afternoon at this time) and if she needs to move up the EGD. Please, advise Follow-up by: Jesse Fall RN,  May 13, 2010 9:24 AM  Additional Follow-up for Phone Call Additional follow up Details #1::        How about just moving her  for direct EGD for Tue 05/17/2009, since she was already evaluated in ER and it seems like they did not find anything? Additional Follow-up by: Hart Carwin MD,  May 13, 2010 12:32 PM    Additional Follow-up for Phone Call Additional follow up Details #2::    Scheduled patient for 05/17/10 at 9:30 AM for EGD with Dr. Juanda Chance at [facility]. Patient aware. Cancelled EGD for 05/24/10 and MD visit for 05/25/10. Patient aware of appointment. Follow-up by: Jesse Fall RN,  May 13, 2010 1:24 PM

## 2010-06-02 NOTE — Miscellaneous (Signed)
Summary: Orders Update-labs  Clinical Lists Changes  Problems: Added new problem of DIARRHEA (ICD-787.91) Orders: Added new Test order of TLB-Hepatic/Liver Function Pnl (80076-HEPATIC) - Signed Added new Test order of TLB-Lipase (83690-LIPASE) - Signed Added new Test order of T-Aldolase ([address]-60[address]) - Signed

## 2010-06-02 NOTE — Miscellaneous (Signed)
Summary: ER report  Clinical Lists Changes

## 2010-06-03 ENCOUNTER — Inpatient Hospital Stay (HOSPITAL_COMMUNITY): Payer: BC Managed Care – PPO

## 2010-06-03 ENCOUNTER — Other Ambulatory Visit: Payer: Self-pay | Admitting: Surgery

## 2010-06-03 LAB — MRSA PCR SCREENING: MRSA by PCR: NEGATIVE

## 2010-06-05 ENCOUNTER — Encounter: Payer: Self-pay | Admitting: Internal Medicine

## 2010-06-07 ENCOUNTER — Other Ambulatory Visit: Payer: Self-pay | Admitting: Internal Medicine

## 2010-06-08 ENCOUNTER — Encounter: Payer: Self-pay | Admitting: Internal Medicine

## 2010-06-08 ENCOUNTER — Other Ambulatory Visit: Payer: Self-pay | Admitting: Internal Medicine

## 2010-06-08 NOTE — Procedures (Signed)
Summary: Colonoscopy  Patient: [name] Note: All result statuses are Final unless otherwise noted.  Tests: (1) Colonoscopy (COL)   COL Colonoscopy           DONE     [facility]     [address]     [place], [place]  [address]           COLONOSCOPY PROCEDURE REPORT           PATIENT:  Sabrina Welch, Sabrina Welch  MR#:  [id number]     BIRTHDATE:  03-24-1971, 39 yrs. old  GENDER:  female     ENDOSCOPIST:  Barbette Hair. Arlyce Dice, MD     REF. BY:  Kirby Funk, M.D.     PROCEDURE DATE:  06/01/2010     PROCEDURE:  Diagnostic Colonoscopy     ASA CLASS:  Class I     INDICATIONS:  Abdominal pain     MEDICATIONS:   Fentanyl 150 mcg, Versed 15 mg IV, Benadryl 50 mg           DESCRIPTION OF PROCEDURE:   After the risks benefits and     alternatives of the procedure were thoroughly explained, informed     consent was obtained.  Digital rectal exam was performed and     revealed no abnormalities.   The Pentax Colonoscope [id number]     endoscope was introduced through the anus and advanced to the     cecum, which was identified by the ileocecal valve, without     limitations.  The quality of the prep was good, using MoviPrep.     The instrument was then slowly withdrawn as the colon was fully     examined.     <<PROCEDUREIMAGES>>           FINDINGS:  A normal appearing cecum, ileocecal valve, and     appendiceal orifice were identified. The ascending, hepatic     flexure, transverse, splenic flexure, descending, sigmoid colon,     and rectum appeared unremarkable (see image001, image002,     image004, image005, image006, image007, image008, and image009).     Retroflexed views in the rectum revealed no abnormalities.    The     scope was then withdrawn from the patient and the procedure     completed.           COMPLICATIONS:  None     ENDOSCOPIC IMPRESSION:     1) Normal colon     RECOMMENDATIONS:No further GI w/u           REPEAT EXAM:  No        ______________________________     Barbette Hair. Arlyce Dice, MD           CC:           n.     eSIGNED:   Barbette Hair. Kaplan at 06/01/2010 06:10 PM           Lottie Rater, [id number]  Note: An exclamation mark (!) indicates a result that was not dispersed into the flowsheet. Document Creation Date: 06/01/2010 6:10 PM _______________________________________________________________________  (1) Order result status: Final Collection or observation date-time: 06/01/2010 18:05 Requested date-time:  Receipt date-time:  Reported date-time:  Referring Physician:   Ordering Physician: Melvia Heaps [phone]) Specimen Source:  Source: Launa Grill Order Number: [id number] Lab site:
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
## 2010-06-08 NOTE — Progress Notes (Signed)
Summary: ON CALL: nausea, vomiting, and pain   Phone Note Call from Patient   Caller: Patient Call For: Dr Juanda Chance Details for Reason: Vomiting and pain Summary of Call: Patient called c/o ongoing problems w/ vomiting and upper abominal pain. has had for 4 weeks w/ negative evaluations. EMR record reviewed. Was at [facility] yesterday. Dx w/ UTI. Wants to know what to do. I told her that I could not advise over the phone. If she felt that she needed to, then to go to ER for evaluation anr treatment. Could also call office in am for further recommendations from Dr Juanda Chance. Initial call taken by: Hilarie Fredrickson MD,  May 29, 2010 8:18 PM

## 2010-06-08 NOTE — Progress Notes (Signed)
Summary: FYI: [facility] APPT   Phone Note From Other Clinic   Caller: Blanca @ [facility] Call For: DR Juanda Chance Summary of Call: Patient is Sched w- Dr Bertram Savin on 06-06-10 2:40pm pt will need to arrive at 2:20pm Initial call taken by: Leanor Kail [facility],  May 30, 2010 11:02 AM  Follow-up for Phone Call        I have left a message for the patient to call back. Dottie Nelson-Smith CMA Duncan Dull)  May 30, 2010 11:40 AM   Per female, patient is in [place] ER and GI is seeing her. Apparently Dr Arlyce Dice plans to do colonoscopy on patient later on in the week. I will discuss the above information with Mike Gip, PA-C Follow-up by: Lamona Curl CMA Duncan Dull),  May 30, 2010 5:04 PM     Appended Document: FYI: [facility] APPT I have spoken to Mike Gip, PA-C to advise her that patient has appointment with [facility] (patient is currently hospitalized). Amy ask that patient's [facility] appointment be cancelled as well as her colonoscopy as they plan to give patient a colonosocpy and [facility] consult while she is inpatient. Both [facility] and colonoscopy appointments have been cancelled. I have also spoken with Noreene Larsson @ [facility] to cancel colonoscopy on their end. She verbalizes understanding.  Appended Document: FYI: [facility] APPT reviewed and agree

## 2010-06-08 NOTE — Procedures (Signed)
Summary: Instructions for procedure/[place]  Instructions for procedure/[place]   Imported By: Sherian Rein 06/02/2010 14:41:54  _____________________________________________________________________  External Attachment:    Type:   Image     Comment:   External Document

## 2010-06-16 NOTE — Letter (Addendum)
Summary: Patient Notice-Endo Biopsy Results  [facility]  [address] [place], [place] [address]   Phone: [phone]  Fax: [phone]        June 08, 2010 MRN: [id number]    Sabrina Welch [address] [place], [place]  [address]    Dear Ms. Monia Pouch,  I am pleased to inform you that the biopsies taken during your recent endoscopic examination did not show any evidence of cancer upon pathologic examination.  Additional information/recommendations:  __No further action is needed at this time.  Please follow-up with      your primary care physician for your other healthcare needs.  __ Please call [phone] to schedule a return visit to review      your condition.  _x_ Continue with the treatment plan as outlined on the day of your      exam.  You have had Your gall bladder removed recently, please see me as needed   Please call us if you are having persistent problems or have questions about your condition that have not been fully answered at this time.  Sincerely,  Hart Carwin MD  This letter has been electronically signed by your physician.  Appended Document: Patient Notice-Endo Biopsy Results letter mailed to patient's home

## 2010-06-17 ENCOUNTER — Telehealth: Payer: Self-pay | Admitting: Internal Medicine

## 2010-06-17 NOTE — Discharge Summary (Signed)
  Sabrina Welch, PUNCHES NO.:  [id number]  MEDICAL RECORD NO.:  [id number]           PATIENT TYPE:  I  LOCATION:  5006                         FACILITY:  [facility]  PHYSICIAN:  Theressa Millard, M.D.    DATE OF BIRTH:  07/09/1970  DATE OF ADMISSION:  05/29/2010 DATE OF DISCHARGE:  06/05/2010                              DISCHARGE SUMMARY   ADMITTING DIAGNOSIS:  Abdominal pain.  DISCHARGE DIAGNOSES: 1. Chronic cholecystitis, status post cholecystectomy with resolution     of abdominal pain. 2. Fatty infiltration of liver. 3. Gastroesophageal reflux disease. 4. Anxiety disorder. 5. Depression.  The patient is a 40 year old woman who had had abdominal discomfort for about 3 weeks associated with nausea, vomiting, diarrhea.  He had been worked up extensively by Dr. Lina Sar including having ultrasound, HIDA scan, and CT scan in the last 3 weeks.  She was sent to [facility], but they had no idea of what to do for her and so she was referred back to Dr. Valentina Lucks.  He admitted the patient.  HOSPITAL COURSE:  The patient was admitted and underwent further evaluation by both GI and Surgery.  She had also had a colonoscopy done during this admission that was negative.  Dr. Gerrit Friends advised the patient that he was unsure whether the patient's gallbladder was the source of her pain but given no other recourse agreed to have the patient undergo cholecystectomy, but gave her only a 30% chance of having improvement in her pain.  She underwent the procedure on February 3rd.  She had the usual postoperative soreness and discomfort but noticed resolution in the abdominal pain and nausea and vomiting that she had been having for the last 3-4 weeks.  She was discharged in improved condition.  DISCHARGE MEDICATIONS: 1. Ambien 10 mg nightly p.r.n. 2. Carafate 1 gram b.i.d. 3. Donnatal 1 tablet twice daily p.r.n. 4. Lorazepam 0.5 mg t.i.d. p.r.n. 5. Nexium 40 mg b.i.d. 6. NuvaRing  vaginally once monthly. 7. Percocet 5/325 q.4 h. p.r.n. 8. Phenergan 25 mg q.4 h. p.r.n. 9. Pristiq XR 50 mg daily. 10.Wellbutrin SR 200 mg daily. 11.Zofran 4 mg p.r.n.Marland Kitchen  ACTIVITY:  Increase activity slowly.  Return to work June 09, 2010.  FOLLOWUP:  She will make an appointment to see Dr. Valentina Lucks in a couple weeks to talk about tapering off some of the GI medications.     Theressa Millard, M.D.     JO/MEDQ  D:  06/05/2010  T:  06/06/2010  Job:  [id number]  Electronically Signed by Theressa Millard M.D. on 06/17/2010 02:33:25 PM

## 2010-06-22 ENCOUNTER — Other Ambulatory Visit: Payer: BC Managed Care – PPO

## 2010-06-22 ENCOUNTER — Encounter: Payer: Self-pay | Admitting: Physician Assistant

## 2010-06-22 ENCOUNTER — Ambulatory Visit (INDEPENDENT_AMBULATORY_CARE_PROVIDER_SITE_OTHER): Payer: BC Managed Care – PPO | Admitting: Physician Assistant

## 2010-06-22 ENCOUNTER — Encounter (INDEPENDENT_AMBULATORY_CARE_PROVIDER_SITE_OTHER): Payer: Self-pay | Admitting: *Deleted

## 2010-06-22 DIAGNOSIS — R1084 Generalized abdominal pain: Secondary | ICD-10-CM | POA: Insufficient documentation

## 2010-06-22 DIAGNOSIS — F32A Depression, unspecified: Secondary | ICD-10-CM | POA: Insufficient documentation

## 2010-06-22 DIAGNOSIS — F329 Major depressive disorder, single episode, unspecified: Secondary | ICD-10-CM | POA: Insufficient documentation

## 2010-06-22 DIAGNOSIS — R110 Nausea: Secondary | ICD-10-CM | POA: Insufficient documentation

## 2010-06-22 DIAGNOSIS — A09 Infectious gastroenteritis and colitis, unspecified: Secondary | ICD-10-CM

## 2010-06-22 DIAGNOSIS — R197 Diarrhea, unspecified: Secondary | ICD-10-CM

## 2010-06-22 NOTE — Progress Notes (Signed)
Summary: Appt next wk    Phone Note From Other Clinic   Caller: Dr Marga Hoots lunch now will be back at 1pm [phone] Lauren now Call For: Dr Juanda Chance Reason for Call: Schedule Patient Appt Summary of Call: Post hosp fu next wk for complications after gallbladder surgery. Pt having alot of diarrhea Initial call taken by: Leanor Kail [facility],  June 17, 2010 11:59 AM  Follow-up for Phone Call        patient will come see Mike Gip PA on 06/22/10 9:30.  Lauren will send notes.  patient c/o watery diarrrhea and persistent RUQ pain post cholecystectomy. Follow-up by: Darcey Nora RN, CGRN,  June 17, 2010 3:47 PM

## 2010-06-22 NOTE — Consult Note (Signed)
Summary: [place]   Imported By: Sherian Rein 06/16/2010 14:18:39  _____________________________________________________________________  External Attachment:    Type:   Image     Comment:   External Document

## 2010-06-23 ENCOUNTER — Encounter: Payer: Self-pay | Admitting: Physician Assistant

## 2010-06-23 NOTE — Op Note (Signed)
Sabrina Welch, Sabrina Welch                ACCOUNT NO.:  [id number]  MEDICAL RECORD NO.:  [id number]           PATIENT TYPE:  I  LOCATION:  5006                         FACILITY:  [facility]  PHYSICIAN:  Velora Heckler, MD      DATE OF BIRTH:  07-Nov-1970  DATE OF PROCEDURE:  06/03/2010 DATE OF DISCHARGE:                              OPERATIVE REPORT   PREOPERATIVE DIAGNOSIS:  Abdominal pain, nausea and vomiting.  POSTOPERATIVE DIAGNOSIS:  Chronic cholecystitis.  PROCEDURE:  Laparoscopic cholecystectomy with intraoperative cholangiography.  SURGEON:  Velora Heckler, MD  ASSISTANT:  Eber Hong, PA  ANESTHESIA:  General per Dr. Diamantina Monks.  ESTIMATED BLOOD LOSS:  Minimal.  PREPARATION:  ChloraPrep.  COMPLICATIONS:  None.  INDICATIONS:  The patient is a 40 year old white female admitted on the medical service with unrelenting abdominal pain, nausea and vomiting. The patient had an extensive workup by Gastroenterology including upper endoscopy, colonoscopy, CT scan of the abdomen, ultrasound of the abdomen, and nuclear medicine hepatobiliary scanning.  The patient had persistent epigastric and right upper quadrant abdominal pain.  The patient was evaluated by General Surgery and at the request of the patient and Gastroenterology was brought to the operating room for cholecystectomy.  BODY OF REPORT:  Procedure was done in OR #70 at [facility].  The patient was brought to the operating room, placed in a supine position on the operating room table.  Following  administration of general anesthesia, the patient was positioned and then prepped and draped in the usual strict aseptic fashion.  After ascertaining that an adequate level of anesthesia had been achieved, an infraumbilical incision was made with a #15 blade.  Dissection was carried through subcutaneous tissues.  Fascia was incised in the midline and the peritoneal cavity was entered cautiously.   0-Vicryl pursestring suture was placed in the fascia.  An Hasson cannula was introduced under direct vision and secured with a pursestring suture.  Abdomen was insufflated with carbon dioxide.  Laparoscope was introduced and the abdomen explored.  Operative ports were placed along the right costal margin in the midline, midclavicular line, and anterior axillary line.  Fundus of the gallbladder was grasped and retracted cephalad.  There were multiple relatively dense adhesions of the omentum to the undersurface of the gallbladder.  These were gently taken down with blunt dissection and hemostasis achieved with the electrocautery.  Dissection was carried down to the neck of the gallbladder where the peritoneum was incised. Cystic duct and cystic artery were dissected out along their length. Cystic artery was doubly clipped proximally and distally.  A single clip was placed at the neck of the gallbladder to occlude the cystic duct. Cystic duct was incised and clear yellow bile emanates from the cystic duct.  Cystic duct caliber was quite small.  A Cook cholangiography catheter was introduced through a stab wound in the right upper quadrant.  It was inserted into the cystic duct and secured with a Ligaclip.  Using C-arm fluoroscopy, real time cholangiography was performed.  There was rapid filling of a normal caliber biliary tree. There was free  flow distally into the duodenum without filling defect or obstruction.  There was reflux of contrast into the right and left hepatic ductal systems.  Clip was withdrawn and a Cook catheter was removed from the peritoneal cavity.  Cystic duct was triply clipped and divided.  Cystic artery was divided.  Gallbladder was then excised from the gallbladder bed using the hook electrocautery for hemostasis.  Gallbladder was placed into an EndoCatch bag and withdrawn through the umbilical port without difficulty.  On palpation, it does not appear to contain  gallstones.  Right upper quadrant was irrigated with warm saline, which was evacuated.  Good hemostasis was noted.  Pneumoperitoneum was released. Ports were removed under direct vision.  Good hemostasis was noted at all port sites.  All wounds were anesthetized with local anesthetic. All wounds were closed with interrupted 4-0 Monocryl subcuticular sutures.  Wounds were washed and dried and Benzoin and Steri-Strips were applied.  Sterile dressings were applied.  The patient was awakened from anesthesia and brought to the recovery room.  The patient tolerated the procedure well.     Velora Heckler, MD     TMG/MEDQ  D:  06/03/2010  T:  06/04/2010  Job:  [id number]  cc:   Barbette Hair. Arlyce Dice, MD,FACG Hedwig Morton. Juanda Chance, MD Thora Lance, M.D.  Electronically Signed by Darnell Level MD on 06/23/2010 10:33:48 AM

## 2010-06-23 NOTE — Consult Note (Signed)
Sabrina Welch, VO NO.:  [id number]  MEDICAL RECORD NO.:  [id number]          PATIENT TYPE:  INP  LOCATION:  5006                         FACILITY:  [facility]  PHYSICIAN:  Velora Heckler, MD      DATE OF BIRTH:  02-03-1971  DATE OF CONSULTATION:  06/01/2010 DATE OF DISCHARGE:                                CONSULTATION   REQUESTING PHYSICIAN:  Dr. Arlyce Dice.  PRIMARY CARE PHYSICIAN:  Dr. Kirby Funk.  CHIEF COMPLAINT/REASON FOR CONSULTATION:  Abdominal pain of uncertain etiology, questionable cholecystitis.  BRIEF HISTORY:  The patient is a 40 year old white female who reports abdominal pain, which has been ongoing in constant since May 04, 2010.  She says the pain is only relieved with meds.  She complains of nausea and vomiting with pain.  She says the pain is in the midepigastric and in fact points towards the right upper quadrant, although this does not seem to be the only site of discomfort. She says food makes her feel full.  She reports what she thinks is an approximately 8 pound weight loss.  With the pain, she also complains of bloating.  She has had an extensive workup, she was seen January 6 at Urgent Care.  Then she has been seen at [facility].  She has also been seen at [facility].  Her workup has been quite extensive.  She has had lipase as done on January 10, January 12, January 17, and January 29, all of which were normal.  She has had a two-view x-ray of the abdomen on May 30, 2010, which showed a single loop small bowel dilatation.  She has undergone a HIDA scan January 23, which showed prompt visualization of gallbladder.  EF was 71%, and there was no symptoms with CCK infusion.  She has undergone EGD, which was normal. She has had a CT of the abdomen and pelvis, which was normal.  Her most recent abdominal 2-D view was May 30, 2010.  This was also normal. Abdominal ultrasound May 13, 2010, showed gallbladder with no  stones no thickening or pericholecystic fluid.  The pancreas was also normal and no other abnormality was found.  The patient reports she had some diarrhea after her HIDA scan, has been constipated since that time.  She was hospitalized on May 30, 2010, by Dr. Valentina Lucks with ongoing abdominal pain, which she says is only relieved with medication for pain.  She also reports nausea, which is only resolved with medications. We were asked to see the patient in consultation for possible cholecystectomy.  PAST MEDICAL HISTORY: 1. She has a history of fatty liver 2007 and constipation in 2007. 2. GERD, 10 plus years. 3. She was seen in the ER 2009, with 11-week history of nausea and     vomiting.  She was apparently worked up by [facility] at that time.     There was a question of pancreatitis, but this was apparently never     documented. 4. History of anxiety and depression.  She has been on medications and     sees Dr. Westley Chandler for Psychiatry every  6 months. 5. Pancreatitis associated with her second pregnancy.  PAST SURGICAL HISTORY: 1. She had a C-section on August 08, 2008, her second delivery. 2. Vaginal laceration of subtype with her first delivery. 3. She had gum surgery in her 30s.  FAMILY HISTORY:  Mother is living, has high blood pressure and cholesterol issues.  Father is deceased.  He had a history of anxiety, alcohol, and died from trauma.  One brother living in good health.  One sister has had a cholecystectomy.  One sister in good health.  SOCIAL HISTORY:  Tobacco:  Smoked for about 9 years.  No more than a pack a day.  She quit at age 69.  Alcohol none.  Drugs none.  She is married and lives with her husband, has 2 children.  REVIEW OF SYSTEMS:  No documented fevers.  She reports weight loss.  She thinks approximately 10 pounds since Christmas.  Cerebrovascular positive for frequent headaches with the current abdominal discomfort, since January 4.  No history of syncope,  presyncope, no stroke or seizure.  Pulmonary:  Positive for dyspnea on exertion.  No orthopnea. No PND.  No coughing or wheezing.  No recent URI.  Cardiac:  No history of chest pain or palpitations.  GI:  Long history of GERD ongoing positive for nausea, abdominal pain since the 4th diarrhea after a HIDA scan constipation since January 23, she was prepped for her colonoscopy today.  GU:  She was actually treated for possible UTI, when she was seen at [facility] May 28, 2010, but she denies any symptoms related to that currently.  OB/GYN:  Her periods are regular.  She is NuvaRing. Lower Extremities:  No edema.  No claudication.  Musculoskeletal:  No difficulty walking.  No joint tenderness.  No arthritis.  Skin:  She complains of chronic itching but no other skin changes.  Psych:  She denies any additional problems with anxiety or depression.  CURRENT MEDICATIONS: 1. NuvaRing 1 monthly. 2. Donnatal 1 b.i.d. 3. Carafate 1 g b.i.d. 4. Zofran 4 mg p.r.n. 5. Phenergan p.r.n. q.4 h. 6. Percocet 5/325 1 q.4 h. 7. Lorazepam 0.5 t.i.d. p.r.n. 8. Ambien 10 mg p.r.n. 9. Nexium 40 mg b.i.d. 10.Pristiq XR 50 mg daily. 11.Wellbutrin 200 mg b.i.d.  ALLERGIES:  None known.  PHYSICAL EXAMINATION:  GENERAL:  This is a well-nourished, well- developed white female in no acute distress. VITAL SIGNS:  She is 70 inches tall, her weight is 96.12 kg.  Her BMI is 30.4, temperature is 98.4, heart rate is 88, blood pressure is 92/55, respiratory rate is 16, sats are 94% on room air. HEENT:  Head, normocephalic.  Hearing is normal.  Nose, throat and mouth.  No mucosal changes. NECK:  Trachea is in the midline.  Thyroid was not palpable.  Positive pulses.  No bruits. RESPIRATORY:  Normal effort. LUNGS:  Clear to auscultation. CARDIAC:  Normal S1 and S2.  No murmur or rub.  Pulses are +2 and equal upper and lower extremities.  CHEST:  Nontender. ABDOMEN:  Positive bowel sounds.  Abdomen is not  distended, nontender to palpation.  No palpable hepatosplenomegaly.  No hernia, masses or abscess. She did have some intermitant discomfort RUQ, midepigastric area and mid LLQ with repeat exam. GU/RECTAL:  Deferred.  She is scheduled for colonoscopy today. LYMPHADENOPATHY:  No femoral lymphadenopathy noted. MUSCULOSKELETAL:  Joints normal.  She is ambulating without difficulty. Normal gait. SKIN:  No changes noted. NEUROLOGIC:  No focal deficits.  Cranial nerves are intact. PSYCH:  Normal affect.  LABORATORY DATA:  TSH is 3.2.  Lipids shows elevated total cholesterol and LDL.  Sodium is 139, potassium is 3.7, chloride is 106, CO2 is 24, BUN is 7, creatinine is 0.99, total bilirubin 0.4, alk phos 43, SGOT is 14, SGPT is less than 8.  White count is 6.4, hemoglobin is 10, hematocrit is 31, platelets 249,000.  Drug screen was positive for barbiturates, benzodiazepines, and opiates.  UA showed 0-2 rbc and wbc per high-powered field.  Lipase January 29 was 37, January 17 was 22, January12 was 2.8 and January 10 was 31.  Diagnostics as noted above.  IMPRESSION: 1. Ongoing abdominal pain since May 14, 2010. 2. Status post extensive Gastrointestinal workup, all with no     functional reasons for abdominal pain found. 3. History of fatty liver, 2007. 4. History of pancreatitis. 5. History of gastroesophageal reflux disease. 6. History of anxiety and depression.  PLAN:  The patient has undergone extensive workup and schedule for colonoscopy today.  We will review all this with Dr. Gerrit Friends with further recommendations after he sees the patient.     Eber Hong, P.A.   ______________________________ Velora Heckler, MD    WDJ/MEDQ  D:  06/01/2010  T:  06/01/2010  Job:  [id number]  cc:   Dr. Arlyce Dice  Electronically Signed by Sherrie George P.A. on 06/02/2010 02:49:48 PM Electronically Signed by Darnell Level MD on 06/23/2010 10:33:42 AM

## 2010-06-24 ENCOUNTER — Encounter: Payer: Self-pay | Admitting: Physician Assistant
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
## 2010-06-28 NOTE — Letter (Signed)
Summary: Out of Work  [facility]  [address] [place], [place] [address]   Phone: [phone]  Fax: [phone]    June 22, 2010   Employee:  ELLINORE MERCED Below    To Whom It May Concern:   For Medical reasons, please excuse the above named employee from work for the following dates:  Start: 06-27-2010    End:  07-04-2010    If you need additional information, please feel free to contact our office.         Sincerely,        Amy Esterwood PA-C

## 2010-07-07 NOTE — H&P (Signed)
Sabrina Welch, Sabrina [name]:  [id number]  MEDICAL RECORD NO.:  [id number]          PATIENT TYPE:  EMS  LOCATION:  MAJO                         FACILITY:  [facility]  PHYSICIAN:  Lonia Blood, M.D.      DATE OF BIRTH:  Aug 02, 1970  DATE OF ADMISSION:  05/29/2010 DATE OF DISCHARGE:                             HISTORY & PHYSICAL   PRIMARY CARE PHYSICIAN:  Dr. Kirby Funk, [facility]eman.  PRESENTING COMPLAINT:  Abdominal pain, nausea, and vomiting.  HISTORY OF PRESENT ILLNESS:  The patient is a 40 year old female with history of anxiety depression as well as fatty liver who has apparently been having nausea, vomiting, and diarrhea for 3 weeks.  She has had extensive workup as an outpatient by [facility] Dr. Lina Sar.  Per patient, she has had a HIDA scan, ultrasound, CT scan in the last 3 weeks but has not improved.  She continues to have severe abdominal pain for which she went to [facility] yesterday.  They did not do anything but sent her back to [place] here.  The patient continues to have pain, centrally located, just closely to the epigastric region.  It was associated with persistent nausea and vomiting and she is unable to keep anything down.  Her last bowel movement was a week ago after she had the HIDA scan.  She denied any melena.  No bright red blood per rectum.  No hematemesis.  She had an EGD and per patient was normal.  The only test left is a colonoscopy which is scheduled for June 08, 2010.  In the interim, she has been unable to keep anything down hence she is here.  PAST MEDICAL HISTORY:  Significant for anxiety disorder, depression, fatty infiltration of the liver, GERD, and pancreatitis.  She is status post D and C.  She had history of preeclampsia during her last pregnancy.  Also morbidly obese.  ALLERGIES:  She has no known drug allergies.  CURRENT MEDICATIONS:  Nexium 40 mg b.i.d.  She has a NuvaRing.  She has Pristiq, Vicodin 5/500 one tablet  every 6 hour as needed, Wellbutrin, and Zofran.  SOCIAL HISTORY:  She is married, lives in [place] with her husband. She denied any tobacco, alcohol, or IV drug use.  She is relatively active usually.  FAMILY HISTORY:  Denied any significant family history for colon cancer or GI malignancy.  REVIEW OF SYSTEMS:  Generalized weakness otherwise all systems reviewed are negative except per HPI.  PHYSICAL EXAMINATION:  VITAL SIGNS:  Temperature is 99.1, blood pressure 119/71, her pulse 101, respiratory rate 22, sats 100% on room air. GENERAL:  She is awake, alert, oriented.  She is in no acute distress. HEENT:  PERRLA.  EOMI.  No pallor.  No jaundice.  No rhinorrhea. NECK:  Supple.  No JVD.  No lymphadenopathy. RESPIRATORY:  She has good air entry bilaterally.  No wheezes or rales. CARDIOVASCULAR:  She is tachycardic. ABDOMEN:  Soft full with mild epigastric tenderness.  No organomegaly and had positive bowel sounds. EXTREMITIES:  No edema, cyanosis, or clubbing.  LABORATORY DATA:  Her labs, white count is 8.5,  hemoglobin 11.1 with platelet of 271.  Sodium 140, potassium 4.0, chloride 107, CO2 25, glucose 90, BUN 7, creatinine 0.94, albumin 3.4, calcium 8.3.  Her LFTs are within normal, lipase is 37 pregnancy test negative.  Urinalysis shows small blood and turbid urine.  Urine microscopy is negative.  Her previous CT abdomen and pelvis on May 24, 2010, shows no acute findings.  HIDA scan on May 23, 2010,  also showed normal EF with ejection fraction of 71.5%.  ASSESSMENT:  This is a 40 year old female presenting with persistent abdominal pain but also intractable nausea, vomiting, for the past 3 weeks.  The fact that the patient is unable to keep anything down including her medications necessitated this admission.  At this point, she has had GI workup which is still ongoing per patient.  She is scheduled to have a colonoscopy soon.  The cause of her abdominal pain is  therefore still unclear.  This could be functional in nature.  If no findings if all workup was negative.  It could be she is just having some biliary colic, although her gallbladder seems fine and HIDA scan was normal.  Gastritis still a possibility but then again her EGD according to her was normal.  We do not have the results available at this point.  Gastroparesis is likely possible and she may benefit from gastric emptying studies.  PLAN: 1. Persistent nausea, vomiting.  We will admit the patient for     symptoms control.  I will put her some Zofran with Phenergan.  I     will keep her n.p.o. tonight and then clear liquids in the morning,     advance her diet as necessary as she tolerates it.  We will get GI     back to continue with her care in the morning for further testing     and management.  2.  Dehydration.  I will put her on some D5 normal     saline and hydrate her overnight. 2. Anxiety and depression.  I will continue the Wellbutrin as much as     possible. 3. Gastroesophageal reflux disease.  We will put her on b.i.d.     Protonix in the hospital.  Further treatment.  The patient depends     on inputs from GI.     Lonia Blood, M.D.     Verlin Grills  D:  05/29/2010  T:  05/30/2010  Job:  [id number]  Electronically Signed by Lonia Blood M.D. on 07/06/2010 04:16:40 PM

## 2010-07-18 ENCOUNTER — Encounter: Payer: Self-pay | Admitting: Internal Medicine

## 2010-07-18 ENCOUNTER — Ambulatory Visit (INDEPENDENT_AMBULATORY_CARE_PROVIDER_SITE_OTHER): Payer: BC Managed Care – PPO | Admitting: Internal Medicine

## 2010-07-18 DIAGNOSIS — K589 Irritable bowel syndrome without diarrhea: Secondary | ICD-10-CM

## 2010-07-19 NOTE — Discharge Summary (Signed)
Summary: Chronic Cholecystitis    NAME:  Sabrina Welch, Sabrina Welch                ACCOUNT NO.:  [id number]      MEDICAL RECORD NO.:  [id number]           PATIENT TYPE:  I      LOCATION:  5006                         FACILITY:  [facility]      PHYSICIAN:  Theressa Millard, M.D.    DATE OF BIRTH:  April 25, 1971      DATE OF ADMISSION:  05/29/2010   DATE OF DISCHARGE:  06/05/2010                                  DISCHARGE SUMMARY         ADMITTING DIAGNOSIS:  Abdominal pain.      DISCHARGE DIAGNOSES:   1. Chronic cholecystitis, status post cholecystectomy with resolution       of abdominal pain.   2. Fatty infiltration of liver.   3. Gastroesophageal reflux disease.   4. Anxiety disorder.   5. Depression.      The patient is a 40 year old woman who had had abdominal discomfort for   about 3 weeks associated with nausea, vomiting, diarrhea.  He had been   worked up extensively by Dr. Lina Sar including having ultrasound,   HIDA scan, and CT scan in the last 3 weeks.  She was sent to [facility],   but they had no idea of what to do for her and so she was referred back   to Dr. Valentina Lucks.  He admitted the patient.      HOSPITAL COURSE:  The patient was admitted and underwent further   evaluation by both GI and Surgery.  She had also had a colonoscopy done   during this admission that was negative.  Dr. Gerrit Friends advised the patient   that he was unsure whether the patient's gallbladder was the source of   her pain but given no other recourse agreed to have the patient undergo   cholecystectomy, but gave her only a 30% chance of having improvement in   her pain.  She underwent the procedure on February 3rd.  She had the   usual postoperative soreness and discomfort but noticed resolution in   the abdominal pain and nausea and vomiting that she had been having for   the last 3-4 weeks.      She was discharged in improved condition.      DISCHARGE MEDICATIONS:   1. Ambien 10 mg nightly p.r.n.   2. Carafate 1  gram b.i.d.   3. Donnatal 1 tablet twice daily p.r.n.   4. Lorazepam 0.5 mg t.i.d. p.r.n.   5. Nexium 40 mg b.i.d.   6. NuvaRing vaginally once monthly.   7. Percocet 5/325 q.4 h. p.r.n.   8. Phenergan 25 mg q.4 h. p.r.n.   9. Pristiq XR 50 mg daily.   10.Wellbutrin SR 200 mg daily.   11.Zofran 4 mg p.r.n.Marland Kitchen      ACTIVITY:  Increase activity slowly.  Return to work June 09, 2010.      FOLLOWUP:  She will make an appointment to see Dr. Valentina Lucks in a couple   weeks to talk about tapering off some of the GI medications.  Theressa Millard, M.D.               JO/MEDQ  D:  06/05/2010  T:  06/06/2010  Job:  [id number]      Electronically Signed by Theressa Millard M.D. on 06/17/2010 02:33:25 PM
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
## 2010-08-10 LAB — CBC
HCT: 28.4 % — ABNORMAL LOW (ref 36.0–46.0)
HCT: 29 % — ABNORMAL LOW (ref 36.0–46.0)
HCT: 32.5 % — ABNORMAL LOW (ref 36.0–46.0)
Hemoglobin: 10.6 g/dL — ABNORMAL LOW (ref 12.0–15.0)
Hemoglobin: 9.2 g/dL — ABNORMAL LOW (ref 12.0–15.0)
Hemoglobin: 9.6 g/dL — ABNORMAL LOW (ref 12.0–15.0)
MCHC: 33.1 g/dL (ref 30.0–36.0)
MCV: 82.8 fL (ref 78.0–100.0)
MCV: 83.5 fL (ref 78.0–100.0)
Platelets: 188 10*3/uL (ref 150–400)
Platelets: 199 10*3/uL (ref 150–400)
RBC: 3.38 MIL/uL — ABNORMAL LOW (ref 3.87–5.11)
RBC: 3.51 MIL/uL — ABNORMAL LOW (ref 3.87–5.11)
RDW: 18.5 % — ABNORMAL HIGH (ref 11.5–15.5)
RDW: 19.3 % — ABNORMAL HIGH (ref 11.5–15.5)
WBC: 10.3 10*3/uL (ref 4.0–10.5)
WBC: 11.4 10*3/uL — ABNORMAL HIGH (ref 4.0–10.5)
WBC: 8.1 10*3/uL (ref 4.0–10.5)

## 2010-08-10 LAB — COMPREHENSIVE METABOLIC PANEL
ALT: <8 U/L (ref 0–35)
AST: 19 U/L (ref 0–37)
Alkaline Phosphatase: 104 U/L (ref 39–117)
Alkaline Phosphatase: 143 U/L — ABNORMAL HIGH (ref 39–117)
BUN: 2 mg/dL — ABNORMAL LOW (ref 6–23)
BUN: 2 mg/dL — ABNORMAL LOW (ref 6–23)
BUN: 3 mg/dL — ABNORMAL LOW (ref 6–23)
CO2: 22 mEq/L (ref 19–32)
CO2: 24 mEq/L (ref 19–32)
Calcium: 7.9 mg/dL — ABNORMAL LOW (ref 8.4–10.5)
Calcium: 8.3 mg/dL — ABNORMAL LOW (ref 8.4–10.5)
Chloride: 105 mEq/L (ref 96–112)
Chloride: 108 mEq/L (ref 96–112)
Chloride: 109 mEq/L (ref 96–112)
Creatinine, Ser: 0.49 mg/dL (ref 0.4–1.2)
Creatinine, Ser: 0.56 mg/dL (ref 0.4–1.2)
Creatinine, Ser: 0.58 mg/dL (ref 0.4–1.2)
GFR calc Af Amer: >60 mL/min (ref >60)
GFR calc Af Amer: >60 mL/min (ref >60)
GFR calc non Af Amer: >60 mL/min (ref >60)
GFR calc non Af Amer: >60 mL/min (ref >60)
GFR calc non Af Amer: >60 mL/min (ref >60)
Glucose, Bld: 105 mg/dL — ABNORMAL HIGH (ref 70–99)
Glucose, Bld: 88 mg/dL (ref 70–99)
Glucose, Bld: 99 mg/dL (ref 70–99)
Potassium: 3.5 mEq/L (ref 3.5–5.1)
Potassium: 3.7 mEq/L (ref 3.5–5.1)
Sodium: 135 mEq/L (ref 135–145)
Total Bilirubin: 0.2 mg/dL — ABNORMAL LOW (ref 0.3–1.2)
Total Bilirubin: 0.3 mg/dL (ref 0.3–1.2)
Total Bilirubin: 0.3 mg/dL (ref 0.3–1.2)
Total Protein: 4.6 g/dL — ABNORMAL LOW (ref 6.0–8.3)
Total Protein: 5 g/dL — ABNORMAL LOW (ref 6.0–8.3)

## 2010-08-10 LAB — URIC ACID: Uric Acid, Serum: 3.6 mg/dL (ref 2.4–7.0)

## 2010-08-10 LAB — URINALYSIS, ROUTINE W REFLEX MICROSCOPIC
Bilirubin Urine: NEGATIVE
Glucose, UA: NEGATIVE mg/dL
Hgb urine dipstick: NEGATIVE
Specific Gravity, Urine: 1.005 (ref 1.005–1.030)
Urobilinogen, UA: 0.2 mg/dL (ref 0.0–1.0)

## 2010-08-10 LAB — RPR: RPR Ser Ql: NONREACTIVE

## 2010-08-10 LAB — LACTATE DEHYDROGENASE
LDH: 126 U/L (ref 94–250)
LDH: 93 U/L — ABNORMAL LOW (ref 94–250)

## 2010-09-13 NOTE — Op Note (Signed)
NAMESIMREN, POPSON                ACCOUNT NO.:  [id number]   MEDICAL RECORD NO.:  [id number]          PATIENT TYPE:  INP   LOCATION:  9144                          FACILITY:  [facility]   PHYSICIAN:  Carrington Clamp, M.D. DATE OF BIRTH:  1970-09-26   DATE OF PROCEDURE:  08/08/2008  DATE OF DISCHARGE:                               OPERATIVE REPORT   PREOPERATIVE DIAGNOSES:  Suspected macrosomia, history of acute fatty  liver of pregnancy, and new-onset nausea and vomiting.   POSTOPERATIVE DIAGNOSES:  Suspected macrosomia, history of acute fatty  liver of pregnancy, and new-onset nausea and vomiting.   PROCEDURE:  Primary low transverse cesarean section.   SURGEON:  Carrington Clamp, MD   ASSISTANTS:  None.   ANESTHESIA:  Spinal.   FINDINGS:  A boy infant, vertex presentation, Apgars 8 and 9.  Normal  tubes, uterus, and ovaries were seen.  Baby's weight was 8 pounds 4  ounces.   PATHOLOGY:  None.   ESTIMATED BLOOD LOSS:  800 mL.   IV FLUIDS:  2000 mL.   URINE OUTPUT:  75 mL.   COMPLICATIONS:  None.   MEDICATIONS:  Ancef and Pitocin.   COUNTS:  Correct x3.   REASON FOR OPERATION:  Ms. Madisan Bice is a 40 year old G2, P1, who in  her first pregnancy had acute fatty liver of pregnancy; renal failure; a  difficult operative vaginal delivery with vacuum and a vaginal  laceration, which caused postpartum hemorrhage.  The patient called the  office on Friday complaining of new-onset nausea and vomiting.  Her  liver function test had been followed closely, and they had been normal  up until this point.  The patient was admitted last night for  observation.  Her liver function tests and her T-bilirubin as well as  all her other labs were within normal limits.  However, the patient  continued to have nausea and vomiting throughout the night, anytime the  antiemetic medications would wear off.  The patient also underwent an  ultrasound for estimated fetal weight secondary to size  greater than  dates.  The estimated fetal weight was 4185 with BPD off the charts and  abdominal circumference of greater than 91st percentile.   The patient had repeat labs this morning, which again were stable as  well as her blood pressure and urine was negative as well.  After a  lengthy discussion with patient at this time who is 38 weeks because of  the history of the acute fatty liver of pregnancy and the beginning of  acute fatty liver of pregnancy starting out with nausea and vomiting and  also because of the suspected macrosomia, I had a long discussion with  the patient and her husband about the possible risks of baby having  prematurity issues versus waiting to deliver the baby.  We all agreed  that the risks to the baby were minimal compared to the risks of the  patient continuing to go onto develop acute fatty liver of pregnancy.  We also discussed with the patient the mode of delivery.  Also, the  patient had a  favorable cervix.  She had reminded me that the first  delivery was extremely difficult and that the patient had had a vacuum-  assisted delivery because of the patient's exhaustion as well as non-  reassuring fetal heart tones.  The baby weighed 7 pounds 3 ounces at  that time.  The patient also had a vaginal laceration that caused  postpartum hemorrhage that required blood transfusion.  Given the size  of the baby measured on ultrasound and the likelihood of ending in a  cesarean section anyway and the increased risks associated with a  cesarean section after prolonged labor versus elective cesarean section,  we discussed with the patient all the risks, benefits, and alternatives,  and we elected to proceed with an elective primary cesarean section to  avoid the possibility of shoulder dystocia, an additional difficult  vaginal delivery and tearing, as well as a prolonged labor.  The patient  understood the risks, benefits, and alternatives of all the   possibilities and desired the elective C-section as the best mode of  delivery.   The patient was then taken to the operating room and where once the  adequate spinal anesthesia was achieved, the patient was prepped and  draped in the usual sterile fashion in the dorsal supine position with  leftward tilt.  A Pfannenstiel skin incision was made with a scalpel,  carried down to the fascia with a Bovie cautery.  The fascia was incised  in the midline with a scalpel and carried in transverse curvilinear  manner with the Mayo scissors.  The fascia was reflected inferiorly and  superiorly from the rectus muscles, and the rectus muscle was split in  the midline.  The bowel free portion of the peritoneum was entered into  carefully with blunt dissection and the peritoneum incised in superior  and inferior manner with good visualization of the bowel and the  bladder.   The Alexis instrument was then placed and the vesicouterine fascia  tented up and incised in transverse curvilinear manner.  A 2-cm incision  was made in the upper portion of the lower uterine segment after the  bladder had been retracted out of the way until the amnion was entered.  Baby was identified in the vertex presentation, and vacuum-assisted  delivery was effected in order to deliver the baby's head.  The baby was  bulb suctioned, cord was clamped and cut, and the baby was handed to  awaiting pediatrics.  Cord bloods were obtained.   The placenta was delivered manually and the uterus cleared of all  debris.  The uterine incision was then closed with a running lock stitch  of 0 Monocryl.  An imbricating layer of 0 Monocryl was performed as  well.  Once hemostasis was achieved, all instruments were withdrawn from  the abdomen and irrigation had been performed.  Peritoneum was closed  with a running stitch of 2-0 Vicryl, which incorporated the rectus  muscles as a separate layer.  The fascia was closed with a running   stitch of 0 Vicryl.  Subcutaneous tissue was rendered hemostatic with  Bovie cautery and irrigation.  This was closed with interrupted stitches  of 2-0 plain gut.  The skin was closed with staples.  The patient  tolerated the procedure well and was returned to recovery room in stable  condition.       Carrington Clamp, M.D.  Electronically Signed     MH/MEDQ  D:  08/08/2008  T:  08/08/2008  Job:  [id number] 

## 2010-09-16 NOTE — Op Note (Signed)
[name], [name]:  [id number]   MEDICAL RECORD NO.:  [id number]          PATIENT TYPE:  INP   LOCATION:  2116                         FACILITY:  [facility]   PHYSICIAN:  Randye Lobo, M.D.   DATE OF BIRTH:  1970-06-10   DATE OF PROCEDURE:  DATE OF DISCHARGE:                                 OPERATIVE REPORT   PREOPERATIVE DIAGNOSES:  1. Postpartum hemorrhage.  2. Status post vaginal delivery.  3. Acute fatty liver of pregnancy.  4. Coagulopathy.   POSTOPERATIVE DIAGNOSES:  1. Postpartum hemorrhage.  2. Status post vaginal delivery.  3. Acute fatty liver of pregnancy.  4. Coagulopathy.  5. Left vaginal wall laceration and hematoma.   PROCEDURE:  Dilation and curettage, suturing of the left sulcus laceration.   SURGEON:  Conley Simmonds, MD   ANESTHESIA:  MAC converted to general endotracheal.   IV FLUIDS:  700 mL of normal saline, 2 units of packed red blood cells.   ESTIMATED BLOOD LOSS:  1500 mL.   URINE OUTPUT:  6 mL.   COMPLICATIONS:  None.   INDICATIONS FOR PROCEDURE:  The patient is a 40 year old gravida 2, para 0-0-  1-0 Caucasian female, status post vaginal delivery on 01/01/2006 of a viable  female following an induction for acute fatty liver of pregnancy, who  postpartum was noted to develop a coagulopathy as part of the acute fatty  liver syndrome.  The patient received cryoprecipitate and fresh frozen  plasma at the [facility] [place].  The patient had some  transient elevated blood pressure and she was treated with magnesium  although her diagnosis did not fit clinically for HELLP syndrome.  She was  given the magnesium for prophylaxis of seizure.  The patient was transferred  to [facility] on the morning of 01/02/2006 due to the need for intensive  monitoring. Throughout the day, the patient's hemoglobin was noted to drift  downward beginning at 13.3 at 5:34 a.m. on 09/04 and down to 8.0 in the  evening at approximately  7:30 p.m.   When I made rounds on the patient in the evening, and the nurse was  concerned about the amount of vaginal bleeding that the patient had and  indeed on vaginal examination she was noted to have 500 mL of clot in the  vagina followed by active red bleeding.  I was unable to adequately examine  the patient at bedside and a recommendation was made to proceed with a  dilation and curettage procedure along with exam under anesthesia after  consultation and coordination with both the intensivist and the  anesthesiologist on call.  The patient did receive some additional fresh  frozen plasma and cryoprecipitate as she was taken off to the operating  room.   The patient and her husband and family agree to proceed after risks,  benefits, and alternatives were reviewed.   FINDINGS:  Examination under anesthesia revealed a left vaginal wall  laceration measuring approximately 5 cm in length.  This was within a  hematoma.  The cervix of the appeared to be intact and without  laceration.  The episiotomy site was not actively bleeding.  The uterus was noted to be  approximately 20 weeks size and had good tone to it.  Curettage did  demonstrate some products of conception and decidua.   Rectal examination confirmed the absence of sutures in the rectum after the  repair of the sulcus laceration.   PROCEDURE:  The patient was escorted from her intensive care unit suite down  to the operating room.  She did receive Mefoxin 2 grams IV as antibiotic  prophylaxis.   In the operating room, MAC anesthesia was induced and the patient was placed  in the dorsal lithotomy position.  The patient's indwelling Foley catheter  was removed and the vagina and perineum were then sterilely prepped and  draped.  A sterile Foley catheter was replaced inside the bladder.   The procedure began by placing a speculum inside the vagina and evacuating  1000 mL of clot.  This was followed by some active bleeding.   The left  vaginal sulcus laceration and bleeding were appreciated at this time.  This  was noted to be active bleeding.   The MAC anesthesia was not adequate at this time and the patient was  converted over to general endotracheal anesthesia.  The patient was  transfused with 2 units of packed red blood cells.  A hand was inserted  through the cervix at this time and exploration of the uterine cavity was  performed.  There was no obvious products of conception appreciated.  A  speculum was then placed inside the vagina and a ring forceps was placed on  the anterior cervical lip and a banjo curette was used to gently curette the  uterine cavity in all four quadrants.  Some products of conception and  decidua were obtained and sent to pathology.  Again a final manual uterine  exam was performed and there appeared to be some additional products  attached to the anterior abdominal wall which were curetted manually and  then sent to pathology.  The uterine cavity was cleaned at this time.  Hemostasis at the level of the uterus and cervix were excellent.   The sulcus laceration was then addressed.  A running locked suture of 2-0  Vicryl was placed.  This improved hemostasis significantly.  There was still  some oozing at the very cranial aspect of the laceration and a series of  figure-of-eight sutures of 0 Vicryl were performed which created good  hemostasis.  Rectal exam confirmed the absence of sutures in the rectum.  The vagina was packed with a sterile gauze packing at this time.   Some additional mattress sutures of 0 Vicryl were used to reapproximate the  episiotomy site in order to bring the skin edges of the perineum together.   This concluded the patient's procedure.  There were no complications.  All  needle, instrument, and sponge counts were correct.      Randye Lobo, M.D.  Electronically Signed     BES/MEDQ  D:  01/02/2006  T:  01/03/2006  Job:  [id number]

## 2010-09-16 NOTE — Discharge Summary (Signed)
NAMELUDY, MESSAMORE NO.:  [id number]   MEDICAL RECORD NO.:  [id number]          PATIENT TYPE:  INP   LOCATION:  9102                          FACILITY:  [facility]   PHYSICIAN:  Ilda Mori, M.D.   DATE OF BIRTH:  10-Jul-1970   DATE OF ADMISSION:  08/07/2008  DATE OF DISCHARGE:  08/11/2008                               DISCHARGE SUMMARY   FINAL DIAGNOSES:  Intrauterine pregnancy at 21 weeks' gestation,  suspected macrosomia, nausea and vomiting with a history of acute fatty  liver, disease of pregnancy, and a history of postpartum hemorrhage with  her last delivery secondary to significant vaginal laceration.   PROCEDURE:  Primary low transverse cesarean section.   SURGEON:  Carrington Clamp, MD.   COMPLICATIONS:  None.   This 40 year old G2, P1 presents at 68 weeks' gestation complaining of  nausea and vomiting.  The patient was also having some contractions at  this time.  Her antepartum course had been complicated by a history of  acute fatty liver disease of pregnancy.  With her last pregnancy, she  was being watched closely with this pregnancy.  The patient also has a  history of depression was on Wellbutrin throughout her pregnancy and had  a difficult operative vaginal delivery with her last pregnancy.  When  the patient called on the Friday prior to delivery complaining of a new  onset of nausea and vomiting.  LFTs had been watched closely throughout.  At the end at this pregnancy, she was admitted for observation.  Her lab  work returned within normal limits.  An ultrasound was performed which  did show suspected macrosomia.  A discussion was held with the patient  regarding these changes and the association with her fatty liver disease  with pregnancy.  A decision at this point was made to proceed with a  cesarean section.  The patient was taken to the operating room on August 08, 2008, by Dr. Carrington Clamp where a primary low transverse  cesarean  section was performed with the delivery of an 8-pound-4-ounce  female infant with Apgars of 8 and 9.  Delivery went without  complications.  The patient's postoperative course was benign without  any significant fevers.  Her labs remained stable.  Parents did want the  little boy circumscribed prior to discharge which was performed and the  patient was felt ready for discharge on postoperative day #3.  She was  sent home on a regular diet, told to decrease activities, told to  continue her prenatal vitamins, was given a prescription for Percocet  #30 one to two every 4-6 hours as needed for pain, told she could use  over-the-counter ibuprofen up to 600 mg every 6 hours as needed for  pain, was to follow up in our office in 4 weeks.  Instructions and  precautions were reviewed with the patient.   LABORATORY DATA ON DISCHARGE:  She had a hemoglobin of 9.3, white blood  cell count of 11.4, and platelets of 199,000.  The patient also had  liver function tests that stayed normal throughout the hospital  course.      Leilani Able, P.A.-C.      Ilda Mori, M.D.  Electronically Signed    MB/MEDQ  D:  08/24/2008  T:  08/25/2008  Job:  [id number]
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
## 2010-09-16 NOTE — Assessment & Plan Note (Signed)
[facility]                           GASTROENTEROLOGY OFFICE NOTE   NAME:Sabrina Welch, Sabrina Welch                       MRN:          [id number]  DATE:02/20/2006                            DOB:          29-Oct-1970    Sabrina Welch is a 40 year old white female we saw at [facility] 1 day after  delivery for acute fatty liver of pregnancy.  She delivered on January 01, 2006.  At that time her transaminases were elevated in 100s and her serum  albumin dropped as low as 1.5.  She was mildly jaundiced with direct and  indirect hyperbilirubinemia.  There were no obstructive symptoms and the  gallbladder did not show any stones except for a small amount of sludge.  There were no signs of chronic liver disease.  Since the delivery the  patient has been breast-feeding and had been feeling gradually stronger.  She denies any pruritus, jaundice, abdominal pain.  Serial liver function is  done by Dr. Edward Jolly shows still mild abnormalities of the transaminases on  January 15, 2006.  The most recent liver function that is on January 30, 2006, showed complete resolution of the liver abnormalities, normal  transaminases, albumin, as well as alkaline phosphatase and bilirubin.   Her medications now include:  1. Nexium 40 mg a day.  2. Wellbutrin SR 300 mg b.i.d.  3. Cymbalta.  4. Prenatal vitamins and iron tablets.   PHYSICAL EXAMINATION:  VITAL SIGNS:  Blood pressure 100/70, pulse 80, weight  176 pounds.  GENERAL:  She was alert, oriented, no distress.  HEENT:  Sclerae were nonicteric.  NECK:  Supple, no adenopathy.  Thyroid was normal.  LUNGS:  Clear to auscultation.  CARDIAC:  Normal S1, normal S2.  ABDOMEN:  Soft, relaxed, with normoactive bowel sounds.  There was no  tenderness.  Liver edge was at costal margin and by percussion was about 8-9  cm, splenic tip not palpable, no fluid wave.  EXTREMITIES:  No edema.  SKIN:  Showed no stigmata of chronic liver  disease.   IMPRESSION:  24. A 41 year old white female with acute fatty liver of pregnancy which      has gradually resolved since her delivery on September 3, with the      normalization of her liver function tests as of January 30, 2006.  2. Functional constipation.  3. Gastroesophageal reflux by history, controlled on Nexium.   PLAN:  1. Repeat ultrasound of the liver and repeat liver function tests today.      If both studies are normal she can go on birth control pills as per Dr.      Rica Records selection.  2. It is okay also to take Tylenol but I recommended against using alcohol      for another 2 months.  3. Refill for Nexium.  4. Repeat liver function tests in 6 months providing today's test will be      normal.  If it is normal, we will bring her back in 4 weeks for repeat      liver function tests.  5. The patient was  asking what is the chance of having the same problem      with the next pregnancy and I could not answer the question, but I      promised to find the statistics on recurrent fatty liver of pregnancy      with each pregnancy.       Hedwig Morton. Juanda Chance, MD      DMB/MedQ  DD:  02/20/2006  DT:  02/21/2006  Job #:  [id number]   cc:   Randye Lobo, M.D.

## 2010-09-16 NOTE — Consult Note (Signed)
NAME:  Sabrina Welch, Sabrina Welch                ACCOUNT NO.:  [id number]   MEDICAL RECORD NO.:  [id number]          PATIENT TYPE:  INP   LOCATION:  NA                           FACILITY:  [facility]   PHYSICIAN:  Barth Kirks, M.D.  DATE OF BIRTH:  [birth year]   DATE OF CONSULTATION:  01/02/2006  DATE OF DISCHARGE:                                   CONSULTATION   CHIEF COMPLAINT:  Hypertension, postpartum, with elevated liver enzymes,  status post transfer from [facility].   HISTORY OF PRESENT ILLNESS:  Sabrina Welch is a 40 year old white female, [employer] employee office manager, at the [employer], who was  admitted to [facility] on December 31, 2005 with 4 days of nausea and  vomiting and pregnancy status with a prime gestation. At the time of  admission, she had noted elevated liver enzymes with an AST at 609, along  with an ALT of 377, alkaline phosphatase of 533, and a fibrinogen level of  141. The patient was admitted to Dr. Marzetta Board service and was induced for a  vaginal delivery on January 01, 2006. She went on to deliver a normal  spontaneous vaginal delivery with episiotomy of a female fetus at 7:45 a.m.  Since her vaginal delivery, she is noted to be hypertensive. Placed on  magnesium sulfate at 1 gram an hour and noted to have perfuse vaginal  bleeding. The patient also has had some continuation of her elevated liver  enzymes as well as elevated creatinine, showing some renal issues. The  patient was transferred to the Adult Intensive Care Unit at [facility]  and then transferred to [facility] Int[facility] Care Unit for  continued treatment of her nausea and vomiting and her acute renal failure  with a creatinine of 2, her hypertension, and with diastolic blood pressures  in the 104. Currently, the patient is complaining of some blurry vision as  well as her nausea and vomiting. The patient is somewhat disoriented at time  of initial H&P.   PAST  MEDICAL HISTORY:  1. Significant for depression, which she has been on Celexa and      Wellbutrin.  2. Irritable bowel syndrome.  3. Gastroesophageal reflux, which she previously takes Nexium.   ALLERGIES:  NO KNOWN DRUG ALLERGIES.   SOCIAL HISTORY:  This is her first pregnancy, however, she is noted to be a  G2. She smokes 1 pack per day of cigarettes, starting at age 29. She is an  Print production planner at [employer]. This is her first live child.   FAMILY HISTORY:  Was reviewed. Only contributory issues is that the mother  and the patient herself had a murmur at birth. It is now resolved. The  patient does have some history of diabetes and hypertension on her mother's  side.   REVIEW OF SYSTEMS:  She is complaining of double vision, a headache, and  mild confusion with uncertain of current events over the last several days.   PHYSICAL EXAMINATION:  GENERAL:  A well-nourished, well-developed, white  female in no acute distress. However, some mild  confusion.  VITAL SIGNS:  Blood pressure 140/104, pulse of 78. Respiratory rate 20.  HEENT:  She had negative JVD. Extraocular muscles x4 were intact. Pupils are  equal, round, and reactive at 4 mm. She had noted dry mucous membranes.  Tongue was midline.  CHEST:  Clear to auscultation bilaterally. Respiratory sound. Good  symmetrical movement.  CARDIAC:  Regular rate and rhythm. No murmur, rub, or gallop.  ABDOMEN:  Her uterus was firm at approximately 3 inches above her umbilicus.  She did have positive bowel sounds. No noted right upper quadrant pain.  Appropriately tender with palpation of the uterus.  EXTREMITIES:  There is no noted edema. Warm to touch.  GENITOURINARY:  She has perfuse bleeding for a full pad since being  transferred to the Intensive Care Unit at [facility] as well as a  Foley was in placed.  NEUROLOGIC:  Cranial nerves 2-12 are intact except for the double vision  that was noted. No noted clonus. She  did have +2 to 3 brisk reflexes at the  patella region bilaterally.   LABORATORY DATA:  Hemoglobin went from 14.5 to 13.3. Hematocrit not noted at  this time. Creatinine was 2 to 1.9. White blood cell count is 19 to 23. This  thought to be demarginalization of status post pregnancy. Glucose was 173 to  117. INR was 1.6. PTT was 41. AST went from 386 to 267. ALT was 275 to 222.  Alkaline phosphatase was 454 to 411. Magnesium went from 3.3 to 4.3.   ASSESSMENT/PLAN:  This is a 40 year old gravida 2, para 1, postpartum  female.  1. POSTPARTUM:  The patient had a healthy female, who is postpartum day 1,      status post normal vaginal delivery with episiotomy. Currently, the      patient is on a Pitocin drip for some bleeding that was noted, status      post delivery. She also received Methergine and Hemabate previously.      Currently, she is only on the Pitocin at this time for breathing      management. She is only receiving Stadol for postpartum pain at this      time.  2. HYPERTENSION:  After postpartum. This could be different etiologies,      likely pre-eclampsia. She is currently on the magnesium sulfate at 1      gram an hour. Will check labs q.6 hours. Would like to maintain a goal      with a therapeutic range of 4.8 to 8.4 with the magnesium sulfate. She      is currently on 1 gram an hour secondary to her elevated creatinine.  For her hypertension, if she is above systolic blood pressures greater than  140, we will give hydralazine 10 mg IV.  1. ELEVATED LIVER ENZYMES:  This is thought possibly secondary to acute      fatty liver or pregnancy. We will go ahead and ultrasound her liver at      this time and will monitor enzymes. She received FFP x2 units and cryo-      precipitates prior to arriving here. We will hold any acetaminophen or      pain medicines that could alter the liver and coagulation status. Her      hepatitis panel was negative. 2. ACUTE RENAL FAILURE:  This is  questionable secondary to dehydration      with a 4 day history of nausea and vomiting, versus per-eclampsia,  versus a mixed disorder. We will go ahead and check a CBC and place a      central line to monitor her fluid status. We will resuscitate her      volume as needed. However, with her pre-eclampsia, she may begin to      have fluid overload. We will monitor for that and we will monitor her      diuresis as needed.  3. NAUSEA AND VOMITING:  We will go ahead and obtain a GI consult. We will      go ahead and continue anti-emetics with Zofran 4 mg IV q.6 p.r.n. We      will currently keep her on ice chips at this time.  4. GASTROINTESTINAL PROPHYLAXIS:  She is on Protonix.  5. DEEP VEIN THROMBOSIS PROPHYLAXIS:  She is on the sequential compression      devices.  6. DOUBLE VISION:  We will go ahead and obtain an ophthalmology consult      secondary to concern for visual changes, either secondary to pre-      eclampsia versus status post pregnancy.  7. INFECTIOUS DISEASE:  We will go ahead and pan-culture her. Her white      blood cell count is elevated. This is thought to be secondary to      demarginalization, status post a stress response from pregnancy.      However, since      she is having some __________, will go ahead and panculture with blood      cultures x2 and urine culture.  8. ELECTROLYTES:  Currently they are stable. We will continue to monitor      with a.m. labs, as well as obtaining a portable chest x-ray and we will      replace as necessary.      Barth Kirks, M.D.     MB/MEDQ  D:  01/02/2006  T:  01/02/2006  Job:  [id number]   cc:   Ilda Mori, M.D.

## 2010-11-01 ENCOUNTER — Other Ambulatory Visit: Payer: Self-pay | Admitting: Internal Medicine

## 2010-11-01 MED ORDER — ESOMEPRAZOLE MAGNESIUM 40 MG PO CPDR: 40.0000 mg | DELAYED_RELEASE_CAPSULE | Freq: Every day | ORAL | Status: DC

## 2010-11-01 NOTE — Telephone Encounter (Signed)
Your prescription has been sent to the pharmacy.

## 2010-12-15 ENCOUNTER — Other Ambulatory Visit: Payer: Self-pay | Admitting: Internal Medicine

## 2010-12-15 MED ORDER — ESOMEPRAZOLE MAGNESIUM 40 MG PO CPDR: 40.0000 mg | DELAYED_RELEASE_CAPSULE | Freq: Every day | ORAL | Status: DC

## 2010-12-15 NOTE — Telephone Encounter (Signed)
I have spoken to patient and we will try to send a 90 day supply to [facility] pharmacy to see if it will be covered.

## 2011-01-20 ENCOUNTER — Telehealth: Payer: Self-pay | Admitting: Internal Medicine

## 2011-01-20 LAB — POCT RAPID STREP A: Streptococcus, Group A Screen (Direct): NEGATIVE

## 2011-01-20 NOTE — Telephone Encounter (Signed)
Patient given the last 3 weights in 2012.

## 2011-01-30 ENCOUNTER — Other Ambulatory Visit: Payer: Self-pay | Admitting: Obstetrics and Gynecology

## 2011-01-30 LAB — URINALYSIS, ROUTINE W REFLEX MICROSCOPIC
Leukocytes, UA: NEGATIVE
Nitrite: NEGATIVE
Specific Gravity, Urine: 1.02
Urobilinogen, UA: 0.2
pH: 5.5

## 2011-01-30 LAB — CBC
HCT: 39.3
MCHC: 33.3
MCV: 87.2
Platelets: 288
RDW: 13.3
WBC: 10.1

## 2011-01-30 LAB — COMPREHENSIVE METABOLIC PANEL
Albumin: 3.5
BUN: 4 — ABNORMAL LOW
Calcium: 8.9
Chloride: 102
Creatinine, Ser: 0.54
Total Bilirubin: 0.5

## 2011-01-30 LAB — URINE MICROSCOPIC-ADD ON

## 2011-03-28 ENCOUNTER — Other Ambulatory Visit: Payer: Self-pay | Admitting: Internal Medicine

## 2011-08-11 ENCOUNTER — Other Ambulatory Visit: Payer: Self-pay | Admitting: Internal Medicine

## 2011-08-11 MED ORDER — ESOMEPRAZOLE MAGNESIUM 40 MG PO CPDR: 40.0000 mg | DELAYED_RELEASE_CAPSULE | Freq: Every day | ORAL | Status: DC

## 2011-08-11 NOTE — Telephone Encounter (Signed)
I have placed 4 boxes of Nexium at front desk for patient to pick up. She requests that we send 3 month rx to [facility]. She was getting Nexium at [facility] but is now no longer on [employer] plan so the medication cost about $60 month. She has also scheduled an appointment with Dr Juanda Chance for routine follow up.

## 2011-08-17 ENCOUNTER — Telehealth: Payer: Self-pay | Admitting: Internal Medicine

## 2011-08-17 MED ORDER — PANTOPRAZOLE SODIUM 40 MG PO TBEC: DELAYED_RELEASE_TABLET | ORAL | Status: DC

## 2011-08-17 NOTE — Telephone Encounter (Signed)
Spoke with patient and advised her that we will switch her from Nexium to Protonix due to insurance coverage. Patient verbalizes understanding. Rx sent.

## 2011-08-31 ENCOUNTER — Encounter: Payer: Self-pay | Admitting: *Deleted

## 2011-09-15 ENCOUNTER — Encounter: Payer: Self-pay | Admitting: Internal Medicine

## 2011-09-15 ENCOUNTER — Other Ambulatory Visit (INDEPENDENT_AMBULATORY_CARE_PROVIDER_SITE_OTHER): Payer: BC Managed Care – PPO

## 2011-09-15 ENCOUNTER — Ambulatory Visit (INDEPENDENT_AMBULATORY_CARE_PROVIDER_SITE_OTHER): Payer: BC Managed Care – PPO | Admitting: Internal Medicine

## 2011-09-15 VITALS — BP 120/64 | HR 72 | Ht 70.0 in | Wt 202.0 lb

## 2011-09-15 DIAGNOSIS — K589 Irritable bowel syndrome without diarrhea: Secondary | ICD-10-CM

## 2011-09-15 DIAGNOSIS — K7689 Other specified diseases of liver: Secondary | ICD-10-CM

## 2011-09-15 DIAGNOSIS — K219 Gastro-esophageal reflux disease without esophagitis: Secondary | ICD-10-CM

## 2011-09-15 DIAGNOSIS — K760 Fatty (change of) liver, not elsewhere classified: Secondary | ICD-10-CM

## 2011-09-15 LAB — HEPATIC FUNCTION PANEL
ALT: 12 U/L (ref 0–35)
AST: 20 U/L (ref 0–37)
Albumin: 4.1 g/dL (ref 3.5–5.2)
Total Protein: 7.5 g/dL (ref 6.0–8.3)

## 2011-09-15 MED ORDER — PROMETHAZINE HCL 12.5 MG PO TABS: ORAL_TABLET | ORAL | Status: DC

## 2011-09-15 MED ORDER — ESOMEPRAZOLE MAGNESIUM 40 MG PO CPDR: 40.0000 mg | DELAYED_RELEASE_CAPSULE | Freq: Every day | ORAL | Status: DC

## 2011-09-15 MED ORDER — DICYCLOMINE HCL 10 MG PO CAPS: 10.0000 mg | ORAL_CAPSULE | Freq: Two times a day (BID) | ORAL | Status: DC
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
## 2011-09-15 NOTE — Patient Instructions (Addendum)
We have sent the following medications to your pharmacy for you to pick up at your convenience: Nexium Bentyl  Phenergan Your physician has requested that you go to the basement for the following lab work before leaving today: Celiac 10, Amylase, Lipase, Hepatic Function Panel Dr Kirby Funk

## 2011-09-18 LAB — CELIAC PANEL 10
Endomysial Screen: NEGATIVE
Gliadin IgA: 4.6 U/mL (ref <20)
Gliadin IgG: 5.8 U/mL (ref <20)
IgA: 200 mg/dL (ref 69–380)

## 2011-11-30 ENCOUNTER — Telehealth: Payer: Self-pay | Admitting: Gastroenterology

## 2011-11-30 ENCOUNTER — Telehealth: Payer: Self-pay | Admitting: Internal Medicine

## 2011-11-30 MED ORDER — ESOMEPRAZOLE MAGNESIUM 40 MG PO CPDR: 40.0000 mg | DELAYED_RELEASE_CAPSULE | Freq: Every day | ORAL | Status: DC

## 2011-11-30 NOTE — Telephone Encounter (Signed)
Pt states she needs a prior auth for her nexium

## 2011-11-30 NOTE — Addendum Note (Signed)
Addended by: Richardson Chiquito on: 11/30/2011 02:42 PM   Modules accepted: Orders

## 2011-11-30 NOTE — Telephone Encounter (Signed)
rx sent

## 2011-12-14 ENCOUNTER — Telehealth: Payer: Self-pay | Admitting: Internal Medicine

## 2011-12-14 MED ORDER — ESOMEPRAZOLE MAGNESIUM 40 MG PO CPDR: 40.0000 mg | DELAYED_RELEASE_CAPSULE | Freq: Every day | ORAL | Status: DC

## 2011-12-14 NOTE — Telephone Encounter (Signed)
Prescription sent to pharmacy and awaiting PA rejection from pharmacy.

## 2011-12-15 ENCOUNTER — Other Ambulatory Visit: Payer: Self-pay | Admitting: Internal Medicine

## 2011-12-15 MED ORDER — ESOMEPRAZOLE MAGNESIUM 40 MG PO CPDR: 40.0000 mg | DELAYED_RELEASE_CAPSULE | Freq: Every day | ORAL | Status: DC

## 2011-12-15 NOTE — Telephone Encounter (Signed)
rx sent to [facility] 

## 2011-12-15 NOTE — Telephone Encounter (Signed)
I have spoken to [facility] @ [facility] and Nexium has been approved. I have advised patient of this as well.

## 2012-01-26 IMAGING — US US ABDOMEN COMPLETE
1 series · 14 of 25 positions shown · non-contrast
Comparison: 04/15/2008

CLINICAL DATA: Abdominal pain

ABDOMINAL ULTRASOUND COMPLETE

[Series 1: us abdomen complete · 0.30mm/px · 14 of 50 slices shown]
[im 1/50]
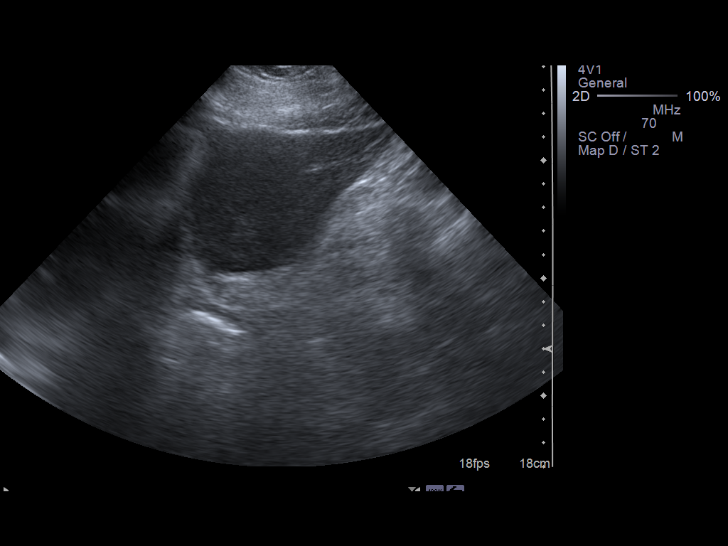
[im 5/50]
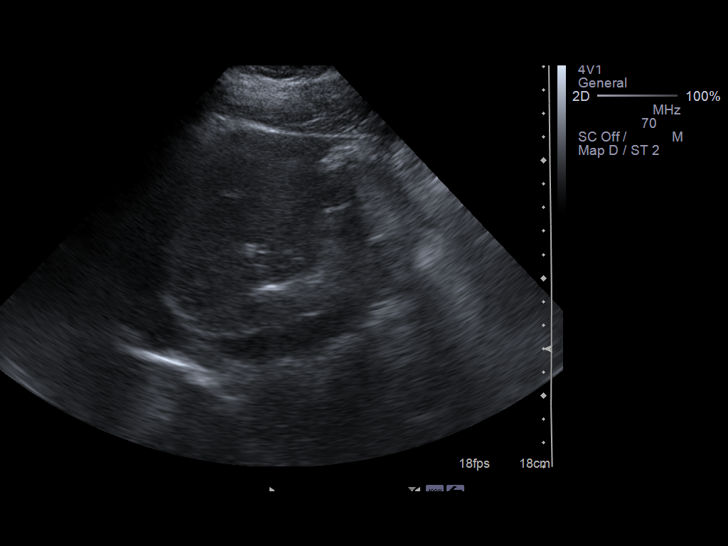
[im 9/50]
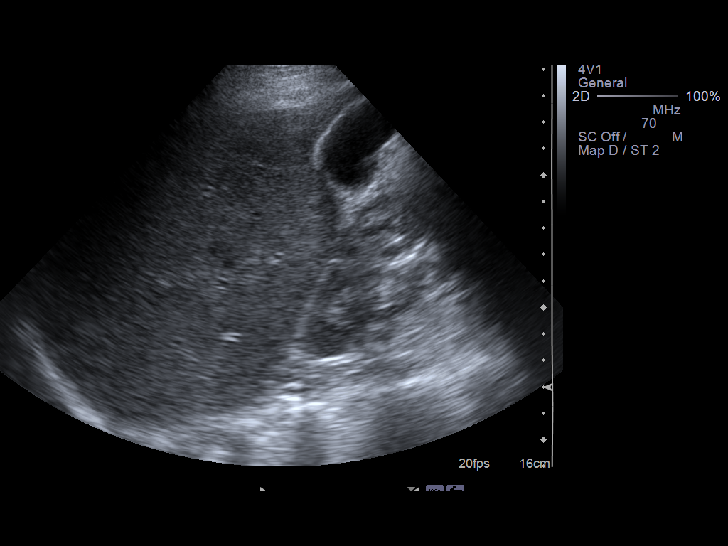
[im 13/50]
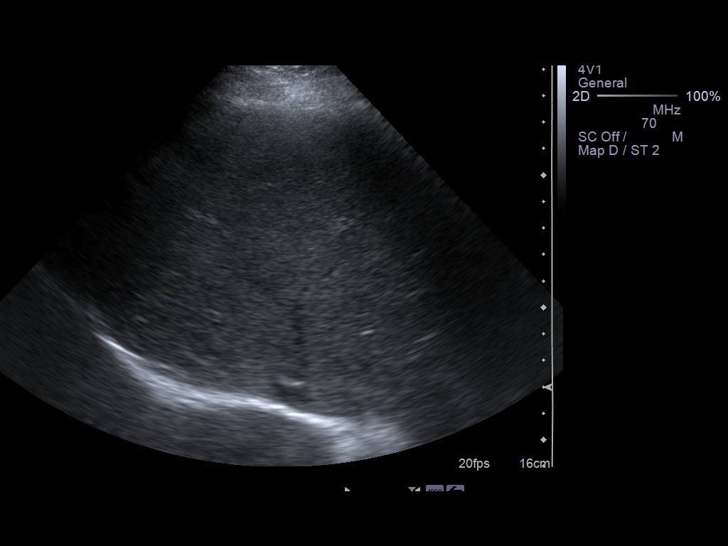
[im 17/50]
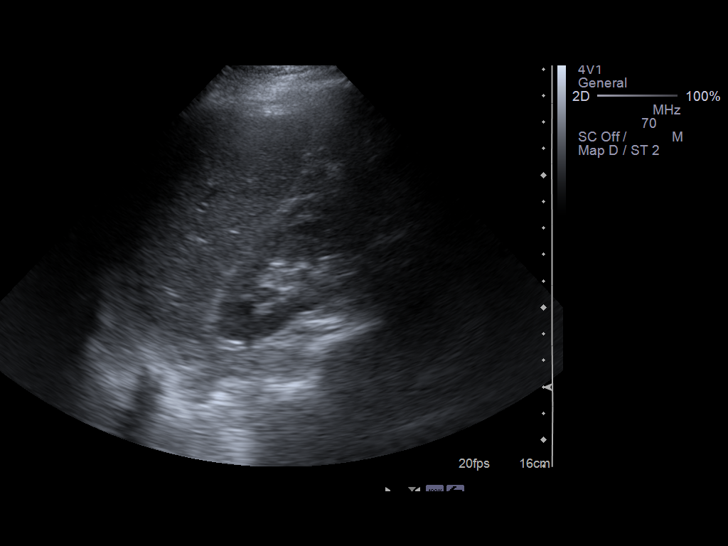
[im 19/50]
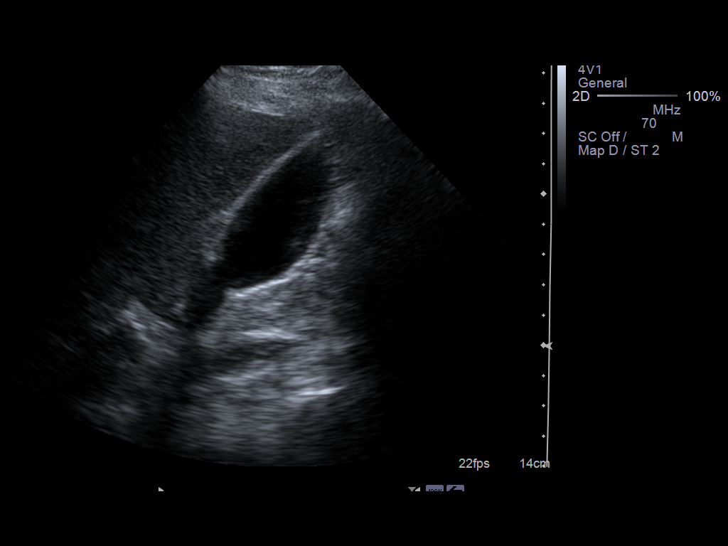
[im 23/50]
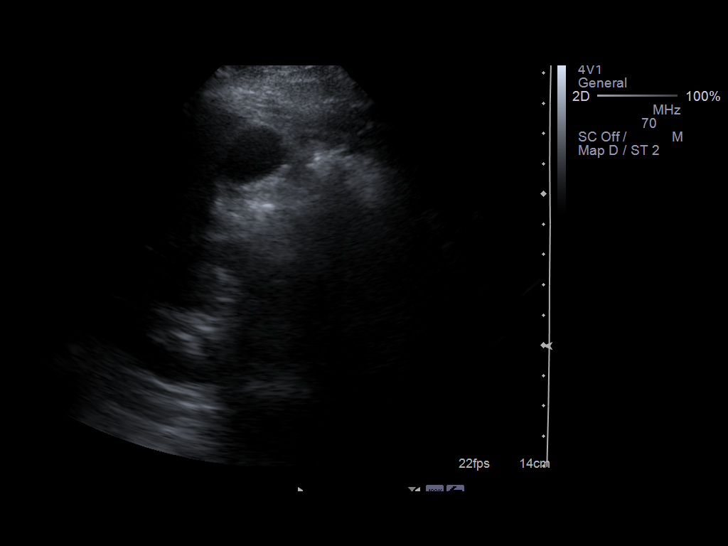
[im 27/50]
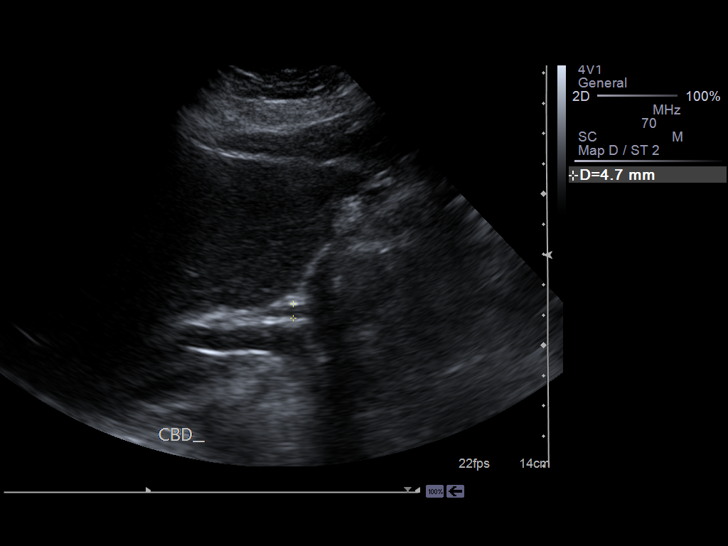
[im 31/50]
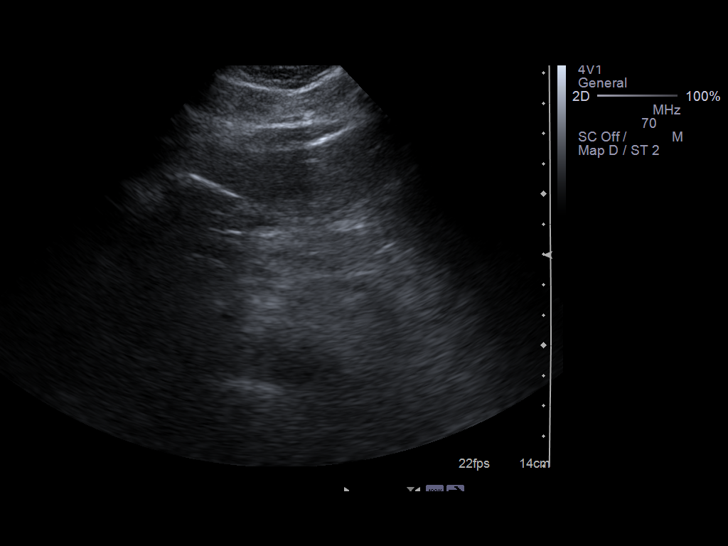
[im 33/50]
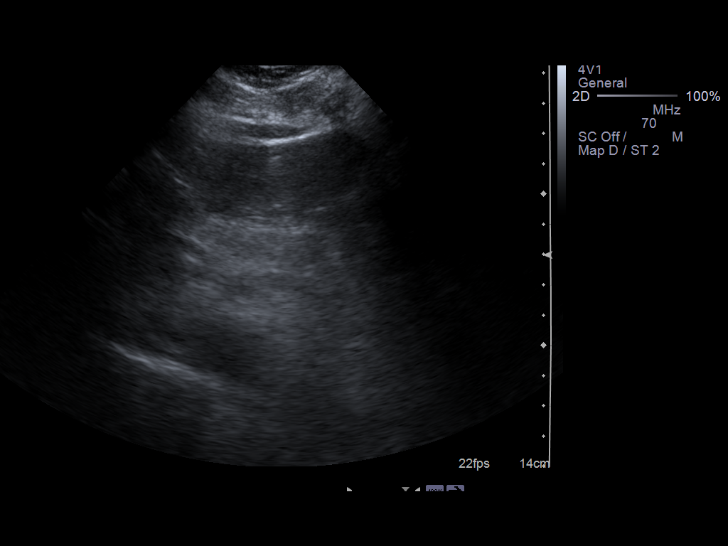
[im 37/50]
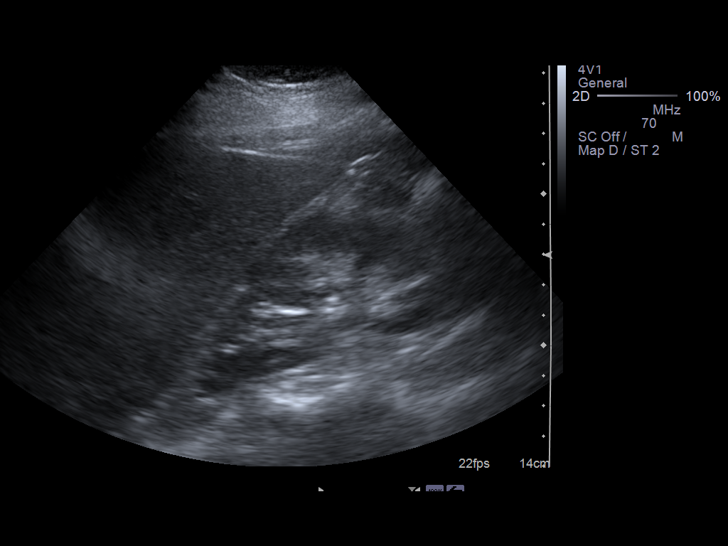
[im 41/50]
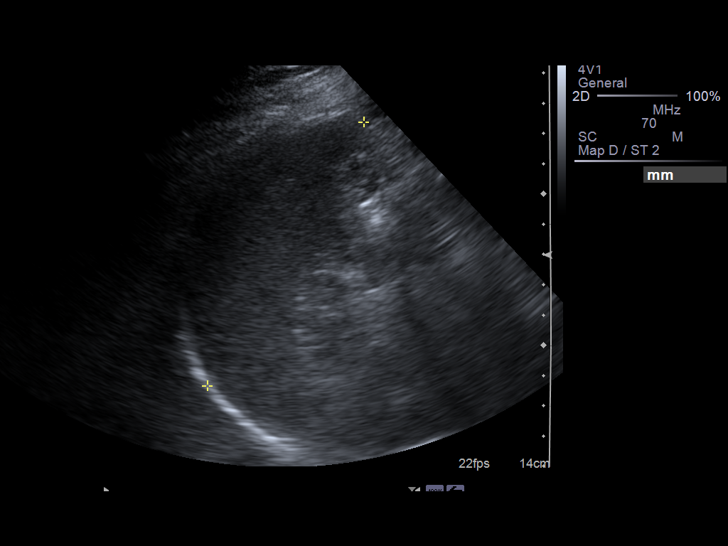
[im 45/50]
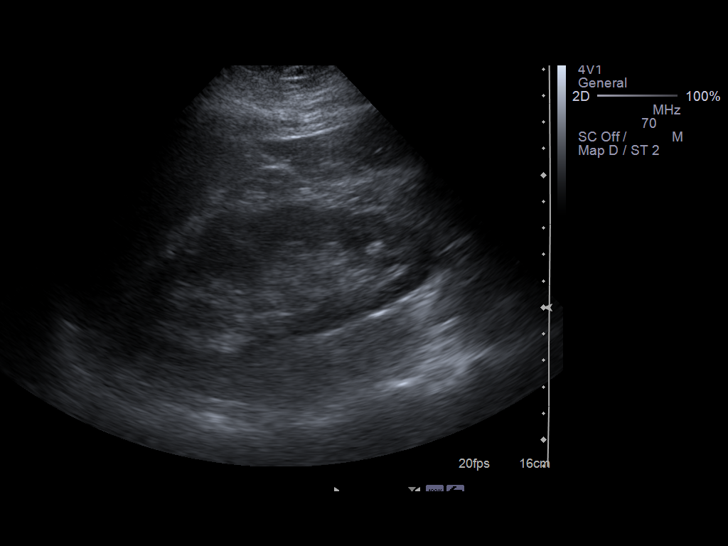
[im 50/50]
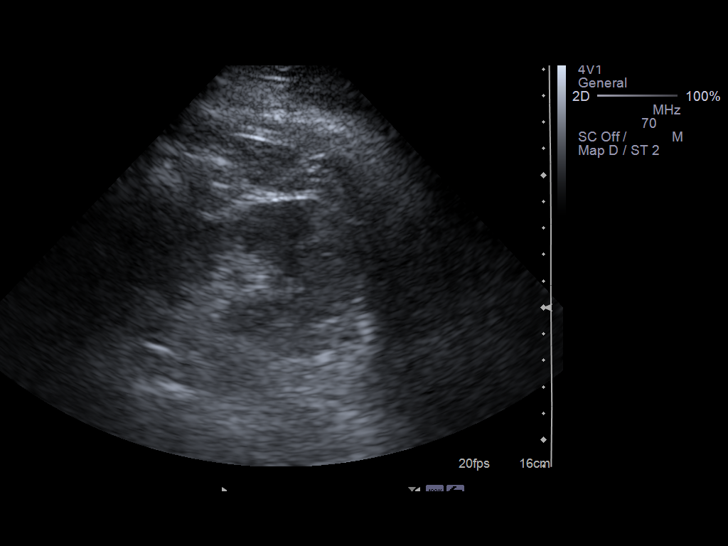

[14 of 25 positions shown; findings below may reference images not displayed]

FINDINGS: Gallbladder:  No gallstones, gallbladder wall thickening, or
pericholecystic fluid.

Common Bile Duct:  Within normal limits in caliber.

Liver: No focal mass lesion identified.  Within normal limits in
parenchymal echogenicity.

IVC:  Appears normal.

Pancreas:  No abnormality identified.

Spleen:  Within normal limits in size and echotexture.

Right kidney:  Normal in size and parenchymal echogenicity.  No
evidence of mass or hydronephrosis.

Left kidney:  Normal in size and parenchymal echogenicity.  No
evidence of mass or hydronephrosis.

Abdominal Aorta:  No aneurysm identified.
IMPRESSION: Negative abdominal ultrasound.

## 2012-02-06 IMAGING — CT CT ABD-PELV W/ CM
2 of 4 series · 17 of 46 positions shown, 19 images · IV contrast (APPLIED)
Comparison: 05/13/2010

CLINICAL DATA: Abdominal pain, vomiting.

CT ABDOMEN AND PELVIS WITH CONTRAST
TECHNIQUE: Multidetector CT imaging of the abdomen and pelvis was
performed following the standard protocol during bolus
administration of intravenous contrast.
Contrast: 100 ml Omnipaque 300 IV.

[Series 2: abd/pelv with 5.0 b31f st · axial · 0.74mm/px · z∈[+660,+1125]mm · 14 of 103 slices shown, 16 images]
[im 5/103  soft-tissue]
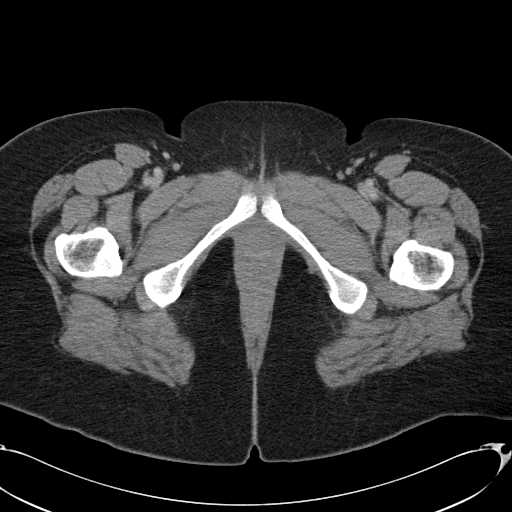
[im 5/103  bone]
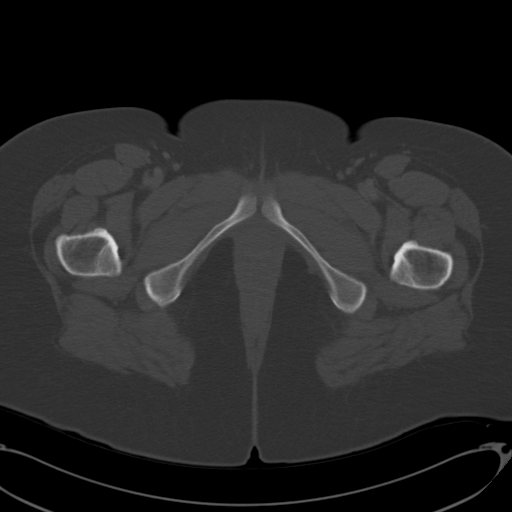
[im 14/103  soft-tissue]
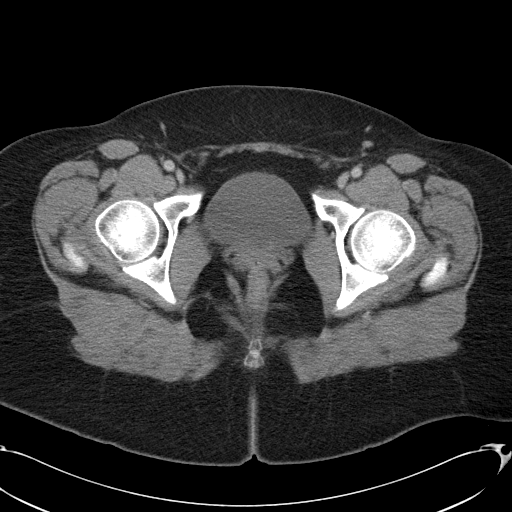
[im 18/103  soft-tissue]
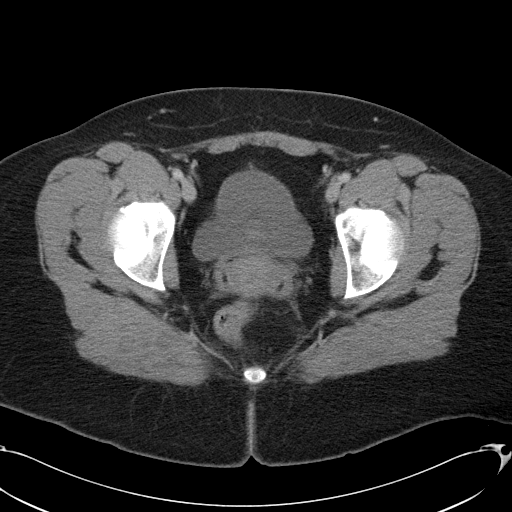
[im 27/103  soft-tissue]
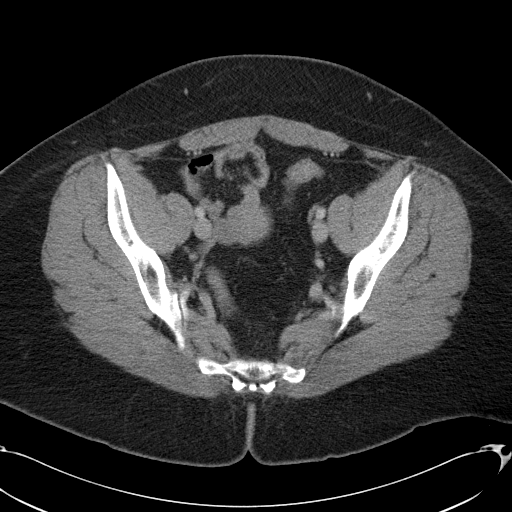
[im 36/103  soft-tissue]
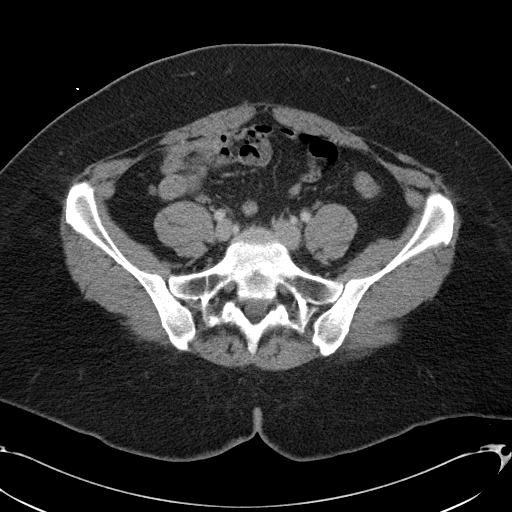
[im 40/103  soft-tissue]
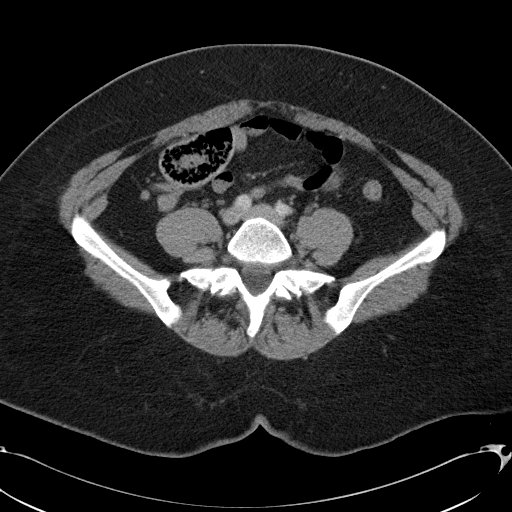
[im 49/103  soft-tissue]
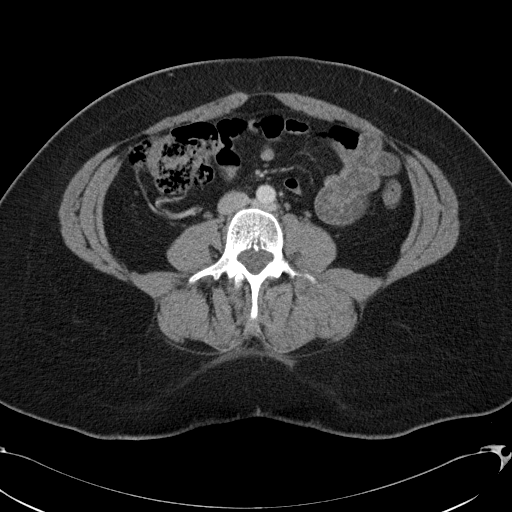
[im 54/103  soft-tissue]
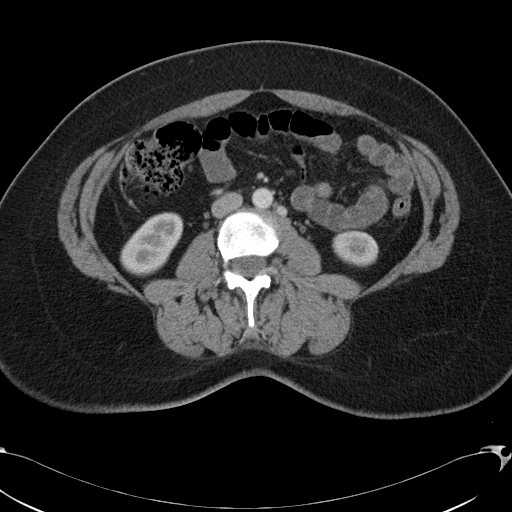
[im 63/103  soft-tissue]
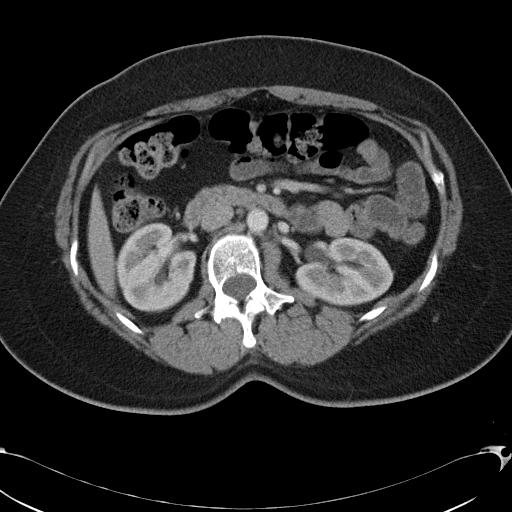
[im 63/103  bone]
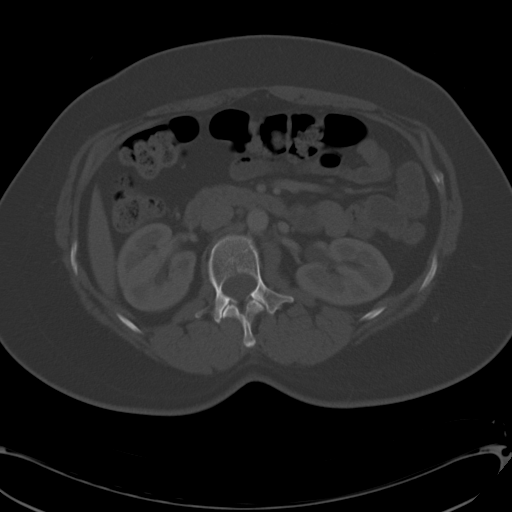
[im 67/103  soft-tissue]
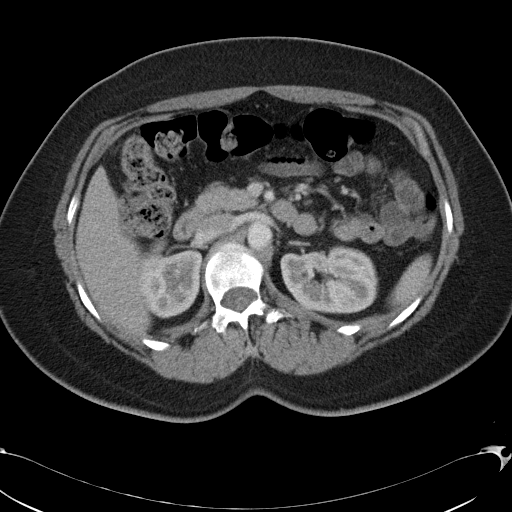
[im 76/103  soft-tissue]
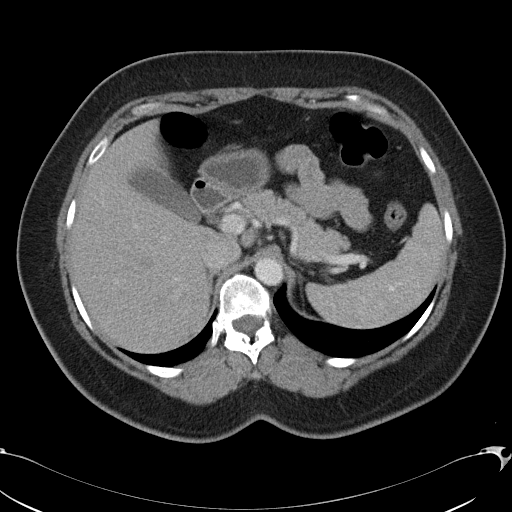
[im 85/103  soft-tissue]
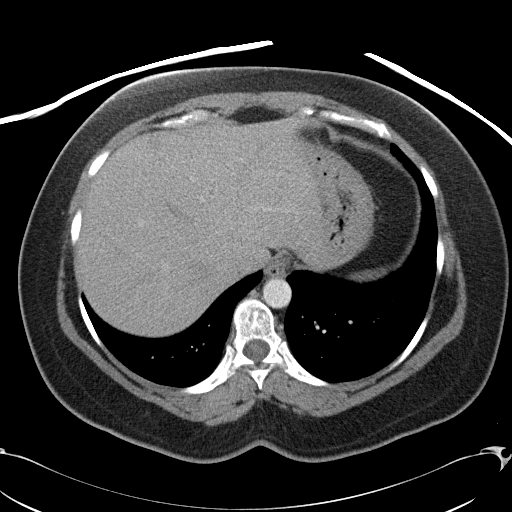
[im 89/103  soft-tissue]
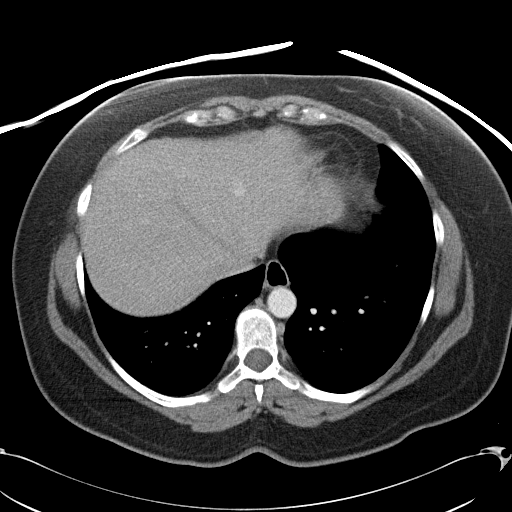
[im 98/103  soft-tissue]
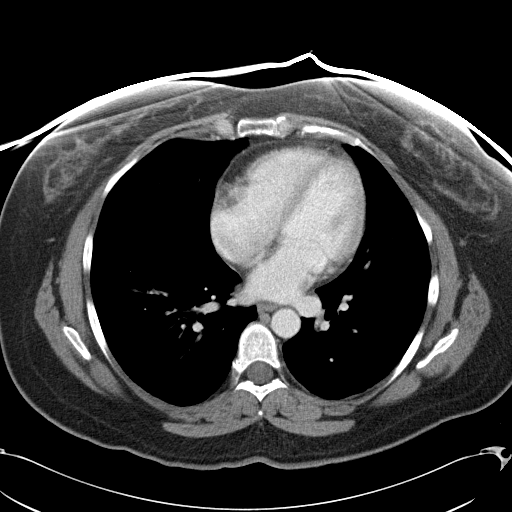

[Series 602: cor · coronal · 1.01mm/px · 3 of 148 slices shown]
[im 50/148  soft-tissue]
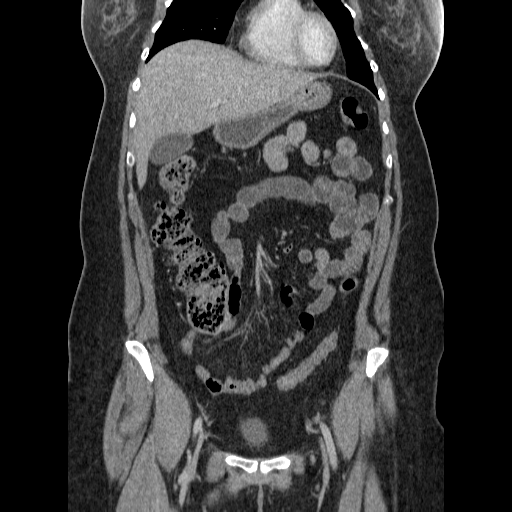
[im 66/148  soft-tissue]
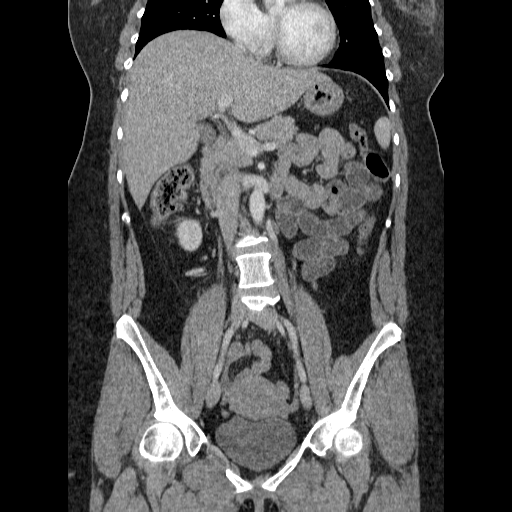
[im 82/148  soft-tissue]
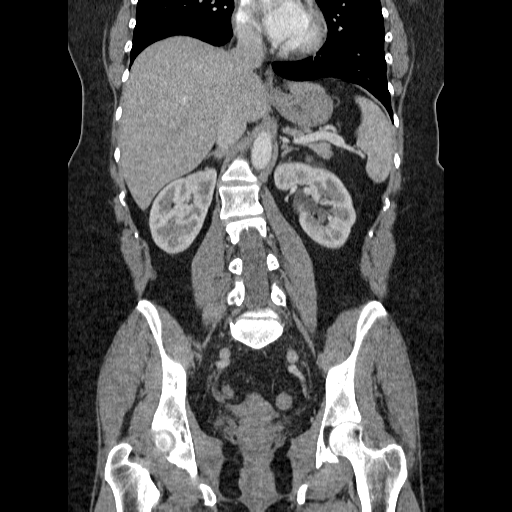

[17 of 46 positions shown; findings below may reference images not displayed]

FINDINGS: Lung bases are clear.  No effusions.  Heart is normal
size.

Liver, spleen, gallbladder, stomach, pancreas, adrenals and kidneys
are unremarkable.  Appendix is visualized and is normal. Bowel
grossly unremarkable.  No free fluid, free air, or adenopathy.
Aorta is normal caliber.  Uterus, adnexa and urinary bladder
grossly unremarkable.

No acute bony abnormality.  Scoliosis in the lumbar spine.
IMPRESSION: No acute findings in the abdomen or pelvis.

Normal appendix.

## 2012-02-20 ENCOUNTER — Other Ambulatory Visit: Payer: Self-pay | Admitting: Internal Medicine

## 2012-03-25 ENCOUNTER — Other Ambulatory Visit: Payer: Self-pay | Admitting: *Deleted

## 2012-03-25 MED ORDER — ESOMEPRAZOLE MAGNESIUM 40 MG PO CPDR: 40.0000 mg | DELAYED_RELEASE_CAPSULE | Freq: Every day | ORAL | Status: DC

## 2012-04-19 ENCOUNTER — Other Ambulatory Visit: Payer: Self-pay | Admitting: Obstetrics and Gynecology

## 2012-04-19 DIAGNOSIS — R928 Other abnormal and inconclusive findings on diagnostic imaging of breast: Secondary | ICD-10-CM

## 2012-05-03 ENCOUNTER — Ambulatory Visit
Admission: RE | Admit: 2012-05-03 | Discharge: 2012-05-03 | Disposition: A | Discharge status: Home / Self Care | Payer: BC Managed Care – PPO | Source: Ambulatory Visit | Attending: Obstetrics and Gynecology | Admitting: Obstetrics and Gynecology

## 2012-05-03 DIAGNOSIS — R928 Other abnormal and inconclusive findings on diagnostic imaging of breast: Secondary | ICD-10-CM

## 2012-07-04 ENCOUNTER — Telehealth: Payer: Self-pay | Admitting: Internal Medicine

## 2012-07-04 NOTE — Telephone Encounter (Signed)
Patient advised that I will leave some samples (3 boxes) at the front desk for her while she awaits her mail order for Nexium.

## 2012-09-13 ENCOUNTER — Other Ambulatory Visit: Payer: Self-pay | Admitting: Internal Medicine

## 2012-10-30 ENCOUNTER — Other Ambulatory Visit: Payer: Self-pay | Admitting: *Deleted

## 2012-10-30 MED ORDER — ESOMEPRAZOLE MAGNESIUM 40 MG PO CPDR: DELAYED_RELEASE_CAPSULE | ORAL | Status: DC

## 2012-12-12 ENCOUNTER — Other Ambulatory Visit: Payer: Self-pay | Admitting: Internal Medicine

## 2013-04-03 ENCOUNTER — Other Ambulatory Visit: Payer: Self-pay | Admitting: Internal Medicine

## 2013-05-06 ENCOUNTER — Other Ambulatory Visit: Payer: Self-pay | Admitting: Internal Medicine

## 2013-05-16 ENCOUNTER — Encounter: Payer: Self-pay | Admitting: Internal Medicine

## 2013-05-16 ENCOUNTER — Ambulatory Visit (INDEPENDENT_AMBULATORY_CARE_PROVIDER_SITE_OTHER): Payer: BC Managed Care – PPO | Admitting: Internal Medicine

## 2013-05-16 ENCOUNTER — Other Ambulatory Visit (INDEPENDENT_AMBULATORY_CARE_PROVIDER_SITE_OTHER): Payer: BC Managed Care – PPO

## 2013-05-16 VITALS — BP 120/76 | HR 101 | Ht 70.0 in | Wt 187.0 lb

## 2013-05-16 DIAGNOSIS — K5900 Constipation, unspecified: Secondary | ICD-10-CM

## 2013-05-16 DIAGNOSIS — K219 Gastro-esophageal reflux disease without esophagitis: Secondary | ICD-10-CM

## 2013-05-16 LAB — CBC WITH DIFFERENTIAL/PLATELET
BASOS ABS: 0 10*3/uL (ref 0.0–0.1)
Basophils Relative: 0.6 % (ref 0.0–3.0)
Eosinophils Absolute: 0.5 10*3/uL (ref 0.0–0.7)
Eosinophils Relative: 8.5 % — ABNORMAL HIGH (ref 0.0–5.0)
HCT: 34.6 % — ABNORMAL LOW (ref 36.0–46.0)
Hemoglobin: 11.4 g/dL — ABNORMAL LOW (ref 12.0–15.0)
LYMPHS PCT: 31.6 % (ref 12.0–46.0)
Lymphs Abs: 1.9 10*3/uL (ref 0.7–4.0)
MCHC: 33 g/dL (ref 30.0–36.0)
MCV: 81.4 fl (ref 78.0–100.0)
MONOS PCT: 9 % (ref 3.0–12.0)
Monocytes Absolute: 0.5 10*3/uL (ref 0.1–1.0)
Neutro Abs: 3.1 10*3/uL (ref 1.4–7.7)
Neutrophils Relative %: 50.3 % (ref 43.0–77.0)
PLATELETS: 306 10*3/uL (ref 150.0–400.0)
RBC: 4.25 Mil/uL (ref 3.87–5.11)
RDW: 16.2 % — ABNORMAL HIGH (ref 11.5–14.6)
WBC: 6.1 10*3/uL (ref 4.5–10.5)

## 2013-05-16 LAB — IBC PANEL
Iron: 29 ug/dL — ABNORMAL LOW (ref 42–145)
SATURATION RATIOS: 5.7 % — AB (ref 20.0–50.0)
Transferrin: 362.9 mg/dL — ABNORMAL HIGH (ref 212.0–360.0)

## 2013-05-16 LAB — HEPATIC FUNCTION PANEL
ALBUMIN: 3.8 g/dL (ref 3.5–5.2)
ALK PHOS: 49 U/L (ref 39–117)
ALT: 10 U/L (ref 0–35)
AST: 18 U/L (ref 0–37)
Bilirubin, Direct: 0 mg/dL (ref 0.0–0.3)
TOTAL PROTEIN: 7.4 g/dL (ref 6.0–8.3)
Total Bilirubin: 0.4 mg/dL (ref 0.3–1.2)

## 2013-05-16 MED ORDER — ESOMEPRAZOLE MAGNESIUM 40 MG PO CPDR: 40.0000 mg | DELAYED_RELEASE_CAPSULE | Freq: Two times a day (BID) | ORAL | Status: DC

## 2013-05-16 MED ORDER — PROMETHAZINE HCL 25 MG PO TABS: ORAL_TABLET | ORAL | Status: DC
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
## 2013-05-16 NOTE — Patient Instructions (Addendum)
We have sent the following medications to your pharmacy for you to pick up at your convenience: Phenergan  We have sent the following prescriptions to your mail in pharmacy: Nexium twice daily  If you have not heard from your mail in pharmacy within 1 week or if you have not received your medication in the mail, please contact us at [phone] so we may find out why.  Your physician has requested that you go to the basement for the following lab work before leaving today: Hepatic Function, CBC, IBC  CC:Dr Kirby FunkJohn Griffin

## 2013-05-19 ENCOUNTER — Other Ambulatory Visit: Payer: Self-pay | Admitting: *Deleted

## 2013-05-19 DIAGNOSIS — D509 Iron deficiency anemia, unspecified: Secondary | ICD-10-CM

## 2013-05-19 MED ORDER — FERROUS SULFATE 325 (65 FE) MG PO TABS: ORAL_TABLET | ORAL | Status: DC

## 2013-06-19 ENCOUNTER — Telehealth: Payer: Self-pay | Admitting: *Deleted

## 2013-06-19 NOTE — Telephone Encounter (Signed)
Patient will come for labs next week. 

## 2013-06-19 NOTE — Telephone Encounter (Signed)
Message copied by Daphine DeutscherMILLER, Breckon Reeves N on Thu Jun 19, 2013  9:28 AM ------      Message from: Daphine DeutscherMILLER, Asiyah Pineau N      Created: Mon May 19, 2013  9:31 AM       Call and remind due for CBC, iron panel for DB on 06/23/13 ------

## 2013-07-07 ENCOUNTER — Encounter: Payer: Self-pay | Admitting: *Deleted

## 2013-09-09 ENCOUNTER — Ambulatory Visit (INDEPENDENT_AMBULATORY_CARE_PROVIDER_SITE_OTHER): Payer: BC Managed Care – PPO | Admitting: Internal Medicine

## 2013-09-09 ENCOUNTER — Encounter: Payer: Self-pay | Admitting: *Deleted

## 2013-09-09 ENCOUNTER — Encounter: Payer: Self-pay | Admitting: Internal Medicine

## 2013-09-09 VITALS — BP 110/80 | HR 102 | Temp 98.4°F | Ht 68.75 in | Wt 201.0 lb

## 2013-09-09 DIAGNOSIS — J45991 Cough variant asthma: Secondary | ICD-10-CM | POA: Insufficient documentation

## 2013-09-09 DIAGNOSIS — R05 Cough: Secondary | ICD-10-CM

## 2013-09-09 DIAGNOSIS — R059 Cough, unspecified: Secondary | ICD-10-CM

## 2013-09-09 DIAGNOSIS — Z23 Encounter for immunization: Secondary | ICD-10-CM

## 2013-09-09 DIAGNOSIS — J019 Acute sinusitis, unspecified: Secondary | ICD-10-CM

## 2013-09-09 MED ORDER — MOMETASONE FURO-FORMOTEROL FUM 100-5 MCG/ACT IN AERO: INHALATION_SPRAY | RESPIRATORY_TRACT | Status: DC

## 2013-09-09 NOTE — Progress Notes (Signed)
   Subjective:    Patient ID: Sabrina Welch, female    DOB: 15-Nov-1970    MRN: [id number]  HPI   [age] yowf quit smoking 2006 with one admit immediately post partum in 2007 required 02 in ICU 100% better except for once or twice a year typically gets  head cold than moves down to chest several  with much worse cough sob since Oct 2014 not directly related to "head cold"self referred to pulmonary clinic 09/09/2013.   09/09/2013 1st [facility] office visit/ Anorah Trias  Chief Complaint  Patient presents with  . Pulmonary Consult    Self referral. Pt c/o cough, SOB, and wheezing for the past 6 days. Her cough is prod with moderate yellow sputum.     worse at hs  Transiently better in past with abx including levaquin/augmentin Mostly sob with cough some better with proair   No obvious other patterns in day to day or daytime variabilty or assoc chronic cough or cp or chest tightness, subjective wheeze overt sinus or hb symptoms. No unusual exp hx or h/o childhood pna/ asthma or knowledge of premature birth.  Sleeping ok without nocturnal  or early am exacerbation  of respiratory  c/o's or need for noct saba. Also denies any obvious fluctuation of symptoms with weather or environmental changes or other aggravating or alleviating factors except as outlined above   Current Medications, Allergies, Complete Past Medical History, Past Surgical History, Family History, and Social History were reviewed in [facility] electronic medical record.           Review of Systems  Constitutional: Negative for fever, chills and unexpected weight change.  HENT: Negative for congestion, dental problem, ear pain, nosebleeds, postnasal drip, rhinorrhea, sinus pressure, sneezing, sore throat, trouble swallowing and voice change.   Eyes: Negative for visual disturbance.  Respiratory: Positive for cough and shortness of breath. Negative for choking.   Cardiovascular: Positive for chest pain. Negative for leg  swelling.  Gastrointestinal: Negative for vomiting, abdominal pain and diarrhea.  Genitourinary: Negative for difficulty urinating.       Acid heartburn Indigestion  Musculoskeletal: Negative for arthralgias.  Skin: Negative for rash.  Neurological: Negative for tremors, syncope and headaches.  Hematological: Does not bruise/bleed easily.       Objective:   Physical Exam  amb wf nad  Wt Readings from Last 3 Encounters:  09/09/13 201 lb (91.173 kg)  05/16/13 187 lb (84.823 kg)  09/15/11 202 lb (91.627 kg)      HEENT: nl dentition, turbinates, and orophanx. Nl external ear canals without cough reflex   NECK :  without JVD/Nodes/TM/ nl carotid upstrokes bilaterally   LUNGS: no acc muscle use,    insp and exp rhonchi   CV:  RRR  no s3 or murmur or increase in P2, no edema   ABD:  soft and nontender with nl excursion in the supine position. No bruits or organomegaly, bowel sounds nl  MS:  warm without deformities, calf tenderness, cyanosis or clubbing  SKIN: warm and dry without lesions    NEURO:  alert, approp, no deficits    07/23/13  No active dz per radiology report     Assessment & Plan:

## 2013-09-09 NOTE — Patient Instructions (Addendum)
Dulera 100 Take 2 puffs first thing in am and then another 2 puffs about 12 hours later.   Please see patient coordinator before you leave today  to schedule sinus CT   Change nexium 40 mg Take 30- 60 min before your first and last meals of the day   GERD (REFLUX)  is an extremely common cause of respiratory symptoms, many times with no significant heartburn at all.    It can be treated with medication, but also with lifestyle changes including avoidance of late meals, excessive alcohol, smoking cessation, and avoid fatty foods, chocolate, peppermint, colas, red wine, and acidic juices such as orange juice.  NO MINT OR MENTHOL PRODUCTS SO NO COUGH DROPS  USE SUGARLESS CANDY INSTEAD (jolley ranchers or Stover's)  NO OIL BASED VITAMINS - use powdered substitutes.  Prevnar today  Please schedule a follow up office visit in 4 weeks, sooner if needed with pfts  Late add sinus CT pos 5/13 > augmentin x 21 d and Prednisone 10 mg take  4 each am x 2 days,   2 each am x 2 days,  1 each am x 2 days and stop

## 2013-09-10 ENCOUNTER — Other Ambulatory Visit: Payer: Self-pay | Admitting: *Deleted

## 2013-09-10 ENCOUNTER — Telehealth: Payer: Self-pay | Admitting: Internal Medicine

## 2013-09-10 ENCOUNTER — Other Ambulatory Visit (INDEPENDENT_AMBULATORY_CARE_PROVIDER_SITE_OTHER): Payer: BC Managed Care – PPO

## 2013-09-10 DIAGNOSIS — D509 Iron deficiency anemia, unspecified: Secondary | ICD-10-CM

## 2013-09-10 DIAGNOSIS — J019 Acute sinusitis, unspecified: Secondary | ICD-10-CM | POA: Insufficient documentation

## 2013-09-10 LAB — CBC WITH DIFFERENTIAL/PLATELET
BASOS ABS: 0 10*3/uL (ref 0.0–0.1)
Basophils Relative: 0.5 % (ref 0.0–3.0)
EOS ABS: 0.3 10*3/uL (ref 0.0–0.7)
Eosinophils Relative: 3.2 % (ref 0.0–5.0)
HEMATOCRIT: 31.3 % — AB (ref 36.0–46.0)
Hemoglobin: 10.4 g/dL — ABNORMAL LOW (ref 12.0–15.0)
LYMPHS ABS: 1.9 10*3/uL (ref 0.7–4.0)
Lymphocytes Relative: 20.3 % (ref 12.0–46.0)
MCHC: 33.1 g/dL (ref 30.0–36.0)
MCV: 81.3 fl (ref 78.0–100.0)
MONO ABS: 0.9 10*3/uL (ref 0.1–1.0)
Monocytes Relative: 9.7 % (ref 3.0–12.0)
NEUTROS ABS: 6.2 10*3/uL (ref 1.4–7.7)
Neutrophils Relative %: 66.3 % (ref 43.0–77.0)
Platelets: 296 10*3/uL (ref 150.0–400.0)
RBC: 3.86 Mil/uL — ABNORMAL LOW (ref 3.87–5.11)
RDW: 14.9 % (ref 11.5–15.5)
WBC: 9.4 10*3/uL (ref 4.0–10.5)

## 2013-09-10 LAB — IBC PANEL
IRON: 31 ug/dL — AB (ref 42–145)
Saturation Ratios: 7 % — ABNORMAL LOW (ref 20.0–50.0)
Transferrin: 315.8 mg/dL (ref 212.0–360.0)

## 2013-09-10 MED ORDER — PREDNISONE 10 MG PO TABS: ORAL_TABLET | ORAL | Status: DC

## 2013-09-10 MED ORDER — AMOXICILLIN-POT CLAVULANATE 875-125 MG PO TABS: 1.0000 | ORAL_TABLET | Freq: Two times a day (BID) | ORAL | Status: DC

## 2013-09-10 NOTE — Assessment & Plan Note (Signed)
See sinus CT [facility] 09/10/2013 > augmentin x 21 d

## 2013-09-10 NOTE — Telephone Encounter (Signed)
Pt wants to let Dr Sherene SiresWert know she gets headaches and feels fatigued. Gave disc to HartselleLeslie.

## 2013-09-10 NOTE — Telephone Encounter (Signed)
Will need to switch but hold on to the old rx for now

## 2013-09-10 NOTE — Telephone Encounter (Signed)
Disc placed in Sabrina Welch's lookat

## 2013-09-10 NOTE — Assessment & Plan Note (Signed)
DDX of  difficult airways managment all start with A and  include Adherence, Ace Inhibitors, Acid Reflux, Active Sinus Disease, Alpha 1 Antitripsin deficiency, Anxiety masquerading as Airways dz,  ABPA,  allergy(esp in young), Aspiration (esp in elderly), Adverse effects of DPI,  Active smokers, plus two Bs  = Bronchiectasis and Beta blocker use..and one C= CHF  Adherence is always the initial "prime suspect" and is a multilayered concern that requires a "trust but verify" approach in every patient - starting with knowing how to use medications, especially inhalers, correctly, keeping up with refills and understanding the fundamental difference between maintenance and prns vs those medications only taken for a very short course and then stopped and not refilled.  The proper method of use, as well as anticipated side effects, of a metered-dose inhaler are discussed and demonstrated to the patient. Improved effectiveness after extensive coaching during this visit to a level of approximately  75% so try dulera 100 2 bid  ? Acid (or non-acid) GERD > always difficult to exclude as up to 75% of pts in some series report no assoc GI/ Heartburn symptoms> rec max (24h)  acid suppression and diet restrictions/ reviewed and instructions given in writing.   Active sinus dz >  (see acute sinusitis)  See instructions for specific recommendations which were reviewed directly with the patient who was given a copy with highlighter outlining the key components.

## 2013-09-10 NOTE — Telephone Encounter (Signed)
Def sinusitis on ct explains most if not all her complaints  rec Augmentin 875 mg take one pill twice daily  X 21 days - take at breakfast and supper with large glass of water.  It would help reduce the usual side effects (diarrhea and yeast infections) if you ate cultured yogurt at lunch.   Prednisone 10 mg take  4 each am x 2 days,   2 each am x 2 days,  1 each am x 2 days and stop

## 2013-09-10 NOTE — Telephone Encounter (Signed)
Recs explained to patient or MW Per pt, she is currently taking Levaquin 500mg  once daily and Prednisone 10mg  (12 day dose pak) - (current dose 6 tabs qd--will taper down) Given 09/05/13 by PCP  Pt wanting to know if she needs a nasal spray also.  Pt would like to know if she needs to stop all this and switch to your regimen or if she needs to just continue what she is taking right now. Please advise Dr Sherene SiresWert. Thanks.

## 2013-09-10 NOTE — Telephone Encounter (Signed)
Will send this message to WestonLeslie as she has the disc and notes in her possession.

## 2013-09-10 NOTE — Telephone Encounter (Signed)
Pt aware to switch to new abx and pred per MW recs Augmentin 875 mg take one pill twice daily X 21 days x 0RF- take at breakfast and supper with large glass of water. Prednisone 10 mg take 4 each am x 2 days, 2 each am x 2 days, 1 each am x 2 days and stop #14 4x0RF  HOLD old rx at this time.  New abx and Pred sent to [facility] per pt request.  Nothing further needed.

## 2013-09-12 ENCOUNTER — Encounter: Payer: Self-pay | Admitting: Internal Medicine

## 2013-09-12 ENCOUNTER — Telehealth: Payer: Self-pay | Admitting: *Deleted

## 2013-09-12 ENCOUNTER — Institutional Professional Consult (permissible substitution): Payer: BC Managed Care – PPO | Admitting: Internal Medicine

## 2013-09-12 NOTE — Telephone Encounter (Signed)
Patient wanted to let Dr. Juanda ChanceBrodie know she did not have the ultrasound at her GYN office like she thought she would. Wants to know if Dr. Juanda ChanceBrodie wants her to have it. Please, advise.

## 2013-09-12 NOTE — Telephone Encounter (Signed)
Patient notified of recommendation. 

## 2013-09-12 NOTE — Telephone Encounter (Signed)
She does not need an ultrasound  At this time.

## 2013-09-15 ENCOUNTER — Encounter (HOSPITAL_COMMUNITY)
Admission: RE | Admit: 2013-09-15 | Discharge: 2013-09-15 | Disposition: A | Discharge status: Home / Self Care | Payer: BC Managed Care – PPO | Source: Ambulatory Visit | Attending: Internal Medicine | Admitting: Internal Medicine

## 2013-09-15 ENCOUNTER — Encounter (HOSPITAL_COMMUNITY): Payer: Self-pay

## 2013-09-15 VITALS — BP 119/83 | HR 87 | Temp 98.5°F | Resp 18

## 2013-09-15 DIAGNOSIS — D509 Iron deficiency anemia, unspecified: Secondary | ICD-10-CM | POA: Insufficient documentation

## 2013-09-15 MED ORDER — SODIUM CHLORIDE 0.9 % IV SOLN
510.0000 mg | Freq: Once | INTRAVENOUS | Status: AC
  Administered 2013-09-15: 510 mg via INTRAVENOUS
  Filled 2013-09-15: qty 17

## 2013-09-15 MED ORDER — SODIUM CHLORIDE 0.9 % IV SOLN
INTRAVENOUS | Status: DC
  Administered 2013-09-15: 08:00:00 via INTRAVENOUS

## 2013-09-15 NOTE — Discharge Instructions (Signed)

## 2013-09-16 ENCOUNTER — Telehealth: Payer: Self-pay | Admitting: Internal Medicine

## 2013-09-16 ENCOUNTER — Other Ambulatory Visit: Payer: Self-pay | Admitting: *Deleted

## 2013-09-16 DIAGNOSIS — D649 Anemia, unspecified: Secondary | ICD-10-CM

## 2013-09-16 MED ORDER — ONDANSETRON 4 MG PO TBDP: 4.0000 mg | ORAL_TABLET | Freq: Three times a day (TID) | ORAL | Status: DC | PRN

## 2013-09-16 MED ORDER — DIPHENOXYLATE-ATROPINE 2.5-0.025 MG PO TABS: ORAL_TABLET | ORAL | Status: DC

## 2013-09-16 NOTE — Telephone Encounter (Signed)
Spoke with Dr. Juanda ChanceBrodie. Rx sent to pharmacy for Zofran ODT 4 mg po every 8 hours prn nausea #20 and Lomotil 1 po TID prn diarrhea #20. Clear liquids only. Will premedicate with Solumedrol 40 mg IV, Benadryl 25 mg po and Tylenol 650 mg po before next Feraheme. Patient aware.

## 2013-09-16 NOTE — Telephone Encounter (Signed)
Patient states she received Feraheme IV yesterday. At 3 AM, she woke up with nausea, vomiting and diarrhea. The diarrhea is watery and she is having abdominal pain at the belly button area. Denies bleeding. She also reports her child had vomiting and stomach pain on Friday. Patient is one Augmentin and steriods per Dr. Sherene SiresWert. She has tried Imodium and Phenergan without relief. She is sipping Sprite. Please, advise.

## 2013-09-23 ENCOUNTER — Encounter (HOSPITAL_COMMUNITY): Payer: Self-pay

## 2013-09-23 ENCOUNTER — Encounter (HOSPITAL_COMMUNITY)
Admission: RE | Admit: 2013-09-23 | Discharge: 2013-09-23 | Disposition: A | Discharge status: Home / Self Care | Payer: BC Managed Care – PPO | Source: Ambulatory Visit | Attending: Internal Medicine | Admitting: Internal Medicine

## 2013-09-23 VITALS — BP 114/75 | HR 91 | Temp 98.4°F | Resp 18

## 2013-09-23 DIAGNOSIS — D649 Anemia, unspecified: Secondary | ICD-10-CM

## 2013-09-23 DIAGNOSIS — D509 Iron deficiency anemia, unspecified: Secondary | ICD-10-CM

## 2013-09-23 MED ORDER — METHYLPREDNISOLONE SODIUM SUCC 40 MG IJ SOLR
40.0000 mg | Freq: Once | INTRAMUSCULAR | Status: AC
  Administered 2013-09-23: 40 mg via INTRAVENOUS
  Filled 2013-09-23: qty 1

## 2013-09-23 MED ORDER — SODIUM CHLORIDE 0.9 % IV SOLN
510.0000 mg | Freq: Once | INTRAVENOUS | Status: AC
  Administered 2013-09-23: 510 mg via INTRAVENOUS
  Filled 2013-09-23: qty 17

## 2013-09-23 MED ORDER — DIPHENHYDRAMINE HCL 25 MG PO CAPS
25.0000 mg | ORAL_CAPSULE | Freq: Once | ORAL | Status: AC
  Administered 2013-09-23: 25 mg via ORAL
  Filled 2013-09-23: qty 1

## 2013-09-23 MED ORDER — SODIUM CHLORIDE 0.9 % IV SOLN
INTRAVENOUS | Status: DC
  Administered 2013-09-23: 250 mL via INTRAVENOUS

## 2013-09-23 MED ORDER — ACETAMINOPHEN 325 MG PO TABS
650.0000 mg | ORAL_TABLET | Freq: Once | ORAL | Status: AC
  Administered 2013-09-23: 650 mg via ORAL
  Filled 2013-09-23: qty 2

## 2013-09-23 NOTE — Progress Notes (Signed)
Pt here for 2nd Feraheme infusion. Premeds of Tylenol/Benadryl/Solumedrol given as ordered. Uneventful infusion of Feraheme. Pt states she will notify Dr Juanda Chance if she experiences any problems after this infusion

## 2013-10-06 ENCOUNTER — Other Ambulatory Visit: Payer: Self-pay | Admitting: *Deleted

## 2013-10-06 MED ORDER — ESOMEPRAZOLE MAGNESIUM 40 MG PO CPDR: 40.0000 mg | DELAYED_RELEASE_CAPSULE | Freq: Two times a day (BID) | ORAL | Status: DC

## 2013-10-06 NOTE — Telephone Encounter (Signed)
New rx sent for 90 day supply of Nexium to [facility]. Rx was originally sent to [facility] in 05/2013. I have contacted [facility] to advise to d/c their rx.

## 2013-10-10 ENCOUNTER — Other Ambulatory Visit: Payer: Self-pay | Admitting: Internal Medicine

## 2013-10-10 DIAGNOSIS — R059 Cough, unspecified: Secondary | ICD-10-CM

## 2013-10-10 DIAGNOSIS — R05 Cough: Secondary | ICD-10-CM

## 2013-10-13 ENCOUNTER — Encounter: Payer: Self-pay | Admitting: Internal Medicine

## 2013-10-13 ENCOUNTER — Ambulatory Visit: Payer: BC Managed Care – PPO | Admitting: Internal Medicine

## 2013-10-13 ENCOUNTER — Ambulatory Visit (INDEPENDENT_AMBULATORY_CARE_PROVIDER_SITE_OTHER): Payer: BC Managed Care – PPO | Admitting: Internal Medicine

## 2013-10-13 VITALS — BP 130/82 | HR 105 | Ht 69.0 in | Wt 209.0 lb

## 2013-10-13 DIAGNOSIS — J45991 Cough variant asthma: Secondary | ICD-10-CM

## 2013-10-13 DIAGNOSIS — R05 Cough: Secondary | ICD-10-CM

## 2013-10-13 DIAGNOSIS — R059 Cough, unspecified: Secondary | ICD-10-CM

## 2013-10-13 MED ORDER — MOMETASONE FURO-FORMOTEROL FUM 200-5 MCG/ACT IN AERO: INHALATION_SPRAY | RESPIRATORY_TRACT | Status: DC

## 2013-10-13 MED ORDER — ALBUTEROL SULFATE HFA 108 (90 BASE) MCG/ACT IN AERS: 2.0000 | INHALATION_SPRAY | Freq: Four times a day (QID) | RESPIRATORY_TRACT | Status: DC | PRN

## 2013-10-13 NOTE — Progress Notes (Signed)
Subjective:    Patient ID: Sabrina Welch, female    DOB: [birth year]    MRN: [id number]  HPI   [age] yowf quit smoking 2006 with one admit immediately post partum in 2007 required 02 in ICU 100% better except for once or twice a year typically gets  head cold than moves down to chest several  with much worse cough sob since Oct 2014 not directly related to "head cold"self referred to pulmonary clinic 09/09/2013.   09/09/2013 1st [place] [facility] office visit/ Wert  Chief Complaint  Patient presents with  . Pulmonary Consult    Self referral. Pt c/o cough, SOB, and wheezing for the past 6 days. Her cough is prod with moderate yellow sputum.    worse at hs  Transiently better in past with abx including levaquin/augmentin Mostly sob with cough some better with proair  rec Dulera 100 Take 2 puffs first thing in am and then another 2 puffs about 12 hours later.   Please see patient coordinator before you leave today  to schedule sinus CT   Change nexium 40 mg Take 30- 60 min before your first and last meals of the day   GERD (REFLUX)  is an extremely common cause of respiratory symptoms, many times with no significant heartburn at all.    It can be treated with medication, but also with lifestyle changes including avoidance of late meals, excessive alcohol, smoking cessation, and avoid fatty foods, chocolate, peppermint, colas, red wine, and acidic juices such as orange juice.  NO MINT OR MENTHOL PRODUCTS SO NO COUGH DROPS  USE SUGARLESS CANDY INSTEAD (jolley ranchers or Stover's)  NO OIL BASED VITAMINS - use powdered substitutes.  Prevnar today  Please schedule a follow up office visit in 4 weeks, sooner if needed with pfts  Late add sinus CT pos 5/13 > augmentin x 21 d and Prednisone 10 mg take  4 each am x 2 days,   2 each am x 2 days,  1 each am x 2 days and stop    10/13/2013 1st [place] [facility] office visit/ Wert on dulera 100 2bid Chief Complaint  Patient presents with  .  Cough    PFT done today-- cough has improved since last OV- Still has some sob with exertion, and cough non-productive but imoproved  Not really clear whether it's resting or saba that helps her doe more but only occurs with inclines/ steps  No obvious day to day or daytime variabilty or assoc chronic cough or cp or chest tightness, subjective wheeze overt sinus or hb symptoms. No unusual exp hx or h/o childhood pna/ asthma or knowledge of premature birth.  Sleeping ok without nocturnal  or early am exacerbation  of respiratory  c/o's or need for noct saba. Also denies any obvious fluctuation of symptoms with weather or environmental changes or other aggravating or alleviating factors except as outlined above   Current Medications, Allergies, Complete Past Medical History, Past Surgical History, Family History, and Social History were reviewed in [facility] electronic medical record.  ROS  The following are not active complaints unless bolded sore throat, dysphagia, dental problems, itching, sneezing,  nasal congestion or excess/ purulent secretions, ear ache,   fever, chills, sweats, unintended wt loss, pleuritic or exertional cp, hemoptysis,  orthopnea pnd or leg swelling, presyncope, palpitations, heartburn, abdominal pain, anorexia, nausea, vomiting, diarrhea  or change in bowel or urinary habits, change in stools or urine, dysuria,hematuria,  rash, arthralgias, visual complaints, headache, numbness  weakness or ataxia or problems with walking or coordination,  change in mood/affect or memory.                      Objective:   Physical Exam  amb wf nad  10/13/2013        209  Wt Readings from Last 3 Encounters:  09/09/13 201 lb (91.173 kg)  05/16/13 187 lb (84.823 kg)  09/15/11 202 lb (91.627 kg)      HEENT: nl dentition, turbinates, and orophanx. Nl external ear canals without cough reflex   NECK :  without JVD/Nodes/TM/ nl carotid upstrokes bilaterally   LUNGS: no acc  muscle use,   Clear bilaterally    CV:  RRR  no s3 or murmur or increase in P2, no edema   ABD:  soft and nontender with nl excursion in the supine position. No bruits or organomegaly, bowel sounds nl  MS:  warm without deformities, calf tenderness, cyanosis or clubbing  SKIN: warm and dry without lesions    NEURO:  alert, approp, no deficits    07/23/13  No active dz per radiology report     Assessment & Plan:

## 2013-10-13 NOTE — Patient Instructions (Addendum)
Increase dulera 200 Take 2 puffs first thing in am and then another 2 puffs about 12 hours later.   Only use your albuterol (proair) as a rescue medication to be used if you can't catch your breath by resting or doing a relaxed purse lip breathing pattern.  - The less you use it, the better it will work when you need it. - Ok to use up to 2 puffs  every 4 hours if you must but call for immediate appointment if use goes up over your usual need - Don't leave home without it !!  (think of it like the spare tire for your car)   Weight control is simply a matter of calorie balance which needs to be tilted in your favor by eating less and exercising more.  To get the most out of exercise, you need to be continuously aware that you are short of breath, but never out of breath, for 30 minutes daily. As you improve, it will actually be easier for you to do the same amount of exercise  in  30 minutes so always push to the level where you are short of breath but never out of breath  Please schedule a follow up visit in 3 months but call sooner if needed

## 2013-10-13 NOTE — Assessment & Plan Note (Addendum)
-   trial of dulera 100 Take 2 bid 09/09/13 > ? Still needing saba with activity but nl pft's p taking saba am 10/13/2013 >  dulera 200 2 bid trial  10/13/2013 p extensive coaching HFA effectiveness =    75%   Deconditioning vs asthma reviewed with pt as not sure the latter is active at this point based on nl pft's including [id number] today    Each maintenance medication was reviewed in detail including most importantly the difference between maintenance and as needed and under what circumstances the prns are to be used.  Please see instructions for details which were reviewed in writing and the patient given a copy.

## 2013-10-13 NOTE — Progress Notes (Signed)
PFT done today. 

## 2013-10-14 ENCOUNTER — Other Ambulatory Visit: Payer: Self-pay | Admitting: *Deleted

## 2013-10-14 DIAGNOSIS — D649 Anemia, unspecified: Secondary | ICD-10-CM

## 2013-10-21 ENCOUNTER — Other Ambulatory Visit (INDEPENDENT_AMBULATORY_CARE_PROVIDER_SITE_OTHER): Payer: BC Managed Care – PPO

## 2013-10-21 DIAGNOSIS — D649 Anemia, unspecified: Secondary | ICD-10-CM

## 2013-10-21 LAB — PULMONARY FUNCTION TEST
DL/VA % pred: 86 %
DL/VA: 4.64 ml/min/mmHg/L
DLCO unc % pred: 83 %
DLCO unc: 25.78 ml/min/mmHg
FEF 25-75 Post: 5.34 L/sec
FEF 25-75 Pre: 4.81 L/sec
FEF2575-%CHANGE-POST: 10 %
FEF2575-%Pred-Post: 158 %
FEF2575-%Pred-Pre: 142 %
FEV1-%Change-Post: 1 %
FEV1-%PRED-PRE: 114 %
FEV1-%Pred-Post: 116 %
FEV1-POST: 4.04 L
FEV1-Pre: 3.98 L
FEV1FVC-%Change-Post: 4 %
FEV1FVC-%Pred-Pre: 105 %
FEV6-%Change-Post: -2 %
FEV6-%PRED-PRE: 108 %
FEV6-%Pred-Post: 105 %
FEV6-PRE: 4.58 L
FEV6-Post: 4.46 L
FEV6FVC-%Change-Post: 0 %
FEV6FVC-%PRED-PRE: 102 %
FEV6FVC-%Pred-Post: 101 %
FVC-%Change-Post: -2 %
FVC-%Pred-Post: 103 %
FVC-%Pred-Pre: 106 %
FVC-PRE: 4.58 L
FVC-Post: 4.46 L
POST FEV1/FVC RATIO: 91 %
POST FEV6/FVC RATIO: 100 %
Pre FEV1/FVC ratio: 87 %
Pre FEV6/FVC Ratio: 100 %
RV % PRED: 36 %
RV: 0.68 L
TLC % PRED: 88 %
TLC: 5.12 L

## 2013-10-21 LAB — CBC WITH DIFFERENTIAL/PLATELET
BASOS ABS: 0.1 10*3/uL (ref 0.0–0.1)
Basophils Relative: 0.7 % (ref 0.0–3.0)
EOS ABS: 0.3 10*3/uL (ref 0.0–0.7)
Eosinophils Relative: 4 % (ref 0.0–5.0)
HEMATOCRIT: 38.4 % (ref 36.0–46.0)
HEMOGLOBIN: 12.8 g/dL (ref 12.0–15.0)
LYMPHS ABS: 1.7 10*3/uL (ref 0.7–4.0)
Lymphocytes Relative: 22.3 % (ref 12.0–46.0)
MCHC: 33.3 g/dL (ref 30.0–36.0)
MCV: 86.5 fl (ref 78.0–100.0)
MONO ABS: 0.6 10*3/uL (ref 0.1–1.0)
Monocytes Relative: 7.7 % (ref 3.0–12.0)
NEUTROS ABS: 5.1 10*3/uL (ref 1.4–7.7)
Neutrophils Relative %: 65.3 % (ref 43.0–77.0)
Platelets: 297 10*3/uL (ref 150.0–400.0)
RBC: 4.44 Mil/uL (ref 3.87–5.11)
RDW: 19.3 % — AB (ref 11.5–15.5)
WBC: 7.8 10*3/uL (ref 4.0–10.5)

## 2013-10-21 LAB — IBC PANEL
Iron: 91 ug/dL (ref 42–145)
Saturation Ratios: 27.9 % (ref 20.0–50.0)
Transferrin: 233.2 mg/dL (ref 212.0–360.0)

## 2013-10-24 ENCOUNTER — Other Ambulatory Visit: Payer: Self-pay | Admitting: *Deleted

## 2013-10-24 DIAGNOSIS — D509 Iron deficiency anemia, unspecified: Secondary | ICD-10-CM

## 2013-11-21 ENCOUNTER — Ambulatory Visit: Payer: BC Managed Care – PPO | Admitting: Internal Medicine

## 2014-01-22 ENCOUNTER — Telehealth: Payer: Self-pay | Admitting: *Deleted

## 2014-01-22 NOTE — Telephone Encounter (Signed)
Patient will come for labs.  

## 2014-01-22 NOTE — Telephone Encounter (Signed)
Message copied by Daphine Deutscher on Thu Jan 22, 2014  9:17 AM ------      Message from: Daphine Deutscher      Created: Fri Oct 24, 2013  1:31 PM       Call and remind due for CBC and iron panel for DB on 01/26/14. Lab in [facility] ------

## 2014-01-22 NOTE — Telephone Encounter (Signed)
Left a message for patient to call back. 

## 2014-01-29 ENCOUNTER — Ambulatory Visit: Payer: BC Managed Care – PPO | Admitting: Internal Medicine

## 2014-02-13 ENCOUNTER — Other Ambulatory Visit (INDEPENDENT_AMBULATORY_CARE_PROVIDER_SITE_OTHER): Payer: BC Managed Care – PPO

## 2014-02-13 DIAGNOSIS — D509 Iron deficiency anemia, unspecified: Secondary | ICD-10-CM | POA: Diagnosis not present

## 2014-02-13 LAB — CBC WITH DIFFERENTIAL/PLATELET
Basophils Absolute: 0 10*3/uL (ref 0.0–0.1)
Basophils Relative: 0.6 % (ref 0.0–3.0)
EOS PCT: 5.6 % — AB (ref 0.0–5.0)
Eosinophils Absolute: 0.4 10*3/uL (ref 0.0–0.7)
HEMATOCRIT: 38.1 % (ref 36.0–46.0)
Hemoglobin: 12.6 g/dL (ref 12.0–15.0)
LYMPHS ABS: 1.9 10*3/uL (ref 0.7–4.0)
Lymphocytes Relative: 25 % (ref 12.0–46.0)
MCHC: 33 g/dL (ref 30.0–36.0)
MCV: 89.9 fl (ref 78.0–100.0)
MONO ABS: 0.8 10*3/uL (ref 0.1–1.0)
Monocytes Relative: 10.1 % (ref 3.0–12.0)
Neutro Abs: 4.5 10*3/uL (ref 1.4–7.7)
Neutrophils Relative %: 58.7 % (ref 43.0–77.0)
PLATELETS: 354 10*3/uL (ref 150.0–400.0)
RBC: 4.23 Mil/uL (ref 3.87–5.11)
RDW: 14 % (ref 11.5–15.5)
WBC: 7.7 10*3/uL (ref 4.0–10.5)

## 2014-02-13 LAB — IBC PANEL
Iron: 49 ug/dL (ref 42–145)
Saturation Ratios: 12.9 % — ABNORMAL LOW (ref 20.0–50.0)
Transferrin: 270.3 mg/dL (ref 212.0–360.0)

## 2014-04-22 ENCOUNTER — Encounter: Payer: Self-pay | Admitting: Internal Medicine

## 2014-04-22 ENCOUNTER — Ambulatory Visit (INDEPENDENT_AMBULATORY_CARE_PROVIDER_SITE_OTHER): Payer: BC Managed Care – PPO | Admitting: Internal Medicine

## 2014-04-22 VITALS — BP 118/86 | HR 110 | Temp 98.6°F | Ht 69.0 in | Wt 251.0 lb

## 2014-04-22 DIAGNOSIS — J45991 Cough variant asthma: Secondary | ICD-10-CM

## 2014-04-22 MED ORDER — AMOXICILLIN-POT CLAVULANATE 875-125 MG PO TABS: 1.0000 | ORAL_TABLET | Freq: Two times a day (BID) | ORAL | Status: DC

## 2014-04-22 MED ORDER — FLUCONAZOLE 100 MG PO TABS: 100.0000 mg | ORAL_TABLET | Freq: Every day | ORAL | Status: DC

## 2014-04-22 MED ORDER — PREDNISONE 10 MG PO TABS: ORAL_TABLET | ORAL | Status: DC

## 2014-04-22 MED ORDER — MOMETASONE FURO-FORMOTEROL FUM 100-5 MCG/ACT IN AERO: INHALATION_SPRAY | RESPIRATORY_TRACT | Status: DC

## 2014-04-22 NOTE — Progress Notes (Signed)
Subjective:    Patient ID: Sabrina Welch, female    DOB: 06-26-1970    MRN: [id number]    Brief patient profile:   43 yowf quit smoking 2006 with one admit immediately post partum in 2007 required 02 in ICU 100% better except for once or twice a year typically gets  head cold that moves down to chest several  with much worse cough sob since Oct 2014 not directly related to "head cold" self referred to pulmonary clinic 09/09/2013.   History of Present Illness  09/09/2013 1st [place] [facility] office visit/ Eufemio Strahm  Chief Complaint  Patient presents with  . Pulmonary Consult    Self referral. Pt c/o cough, SOB, and wheezing for the past 6 days. Her cough is prod with moderate yellow sputum.    worse at hs  Transiently better in past with abx including levaquin/augmentin Mostly sob with cough some better with proair  rec Dulera 100 Take 2 puffs first thing in am and then another 2 puffs about 12 hours later.  sinus CT  Change nexium 40 mg Take 30- 60 min before your first and last meals of the day  GERD  Diet  Prevnar today Late add sinus CT POS  5/13 > augmentin x 21 d and Prednisone 10 mg take  4 each am x 2 days,   2 each am x 2 days,  1 each am x 2 days and stop    10/13/2013 1st [place] [facility] office visit/ Adelaide Pfefferkorn on dulera 100 2bid Chief Complaint  Patient presents with  . Cough    PFT done today-- cough has improved since last OV- Still has some sob with exertion, and cough non-productive but imoproved  Not really clear whether it's resting or saba that helps her doe more but only occurs with inclines/ steps rec Increase dulera 200 Take 2 puffs first thing in am and then another 2 puffs about 12 hours later.  Only use your albuterol (proair) as a rescue medication  Weight control    04/22/2014 f/u ov/Zarif Rathje re: asthma flare since 04/09/14  Chief Complaint  Patient presents with  . Acute Visit    Pt c/o increased SOB and wheezing- was seen by UC on 04/07/14 and was given neb  tx, kenalog inj, abx and pred taper. Her symptoms improved but symptoms started back x 2 days ago.    already given levaquin  Acute onset x 2 weeks with persistent nasal congestion then worse x 2 days / not clear she's taking dulera consistently   No obvious day to day or daytime variabilty or assoc chronic cough or cp or chest tightness, subjective wheeze or overt   hb symptoms. No unusual exp hx or h/o childhood pna/ asthma or knowledge of premature birth.  Sleeping ok without nocturnal  or early am exacerbation  of respiratory  c/o's or need for noct saba. Also denies any obvious fluctuation of symptoms with weather or environmental changes or other aggravating or alleviating factors except as outlined above   Current Medications, Allergies, Complete Past Medical History, Past Surgical History, Family History, and Social History were reviewed in [facility] electronic medical record.  ROS  The following are not active complaints unless bolded sore throat, dysphagia, dental problems, itching, sneezing,  nasal congestion or excess/ purulent secretions, ear ache,   fever, chills, sweats, unintended wt loss, pleuritic or exertional cp, hemoptysis,  orthopnea pnd or leg swelling, presyncope, palpitations, heartburn, abdominal pain, anorexia, nausea, vomiting, diarrhea  or  change in bowel or urinary habits, change in stools or urine, dysuria,hematuria,  rash, arthralgias, visual complaints, headache, numbness weakness or ataxia or problems with walking or coordination,  change in mood/affect or memory.                      Objective:   Physical Exam  amb wf nad  10/13/2013        209  >  04/22/2014  251  Wt Readings from Last 3 Encounters:  09/09/13 201 lb (91.173 kg)  05/16/13 187 lb (84.823 kg)  09/15/11 202 lb (91.627 kg)      HEENT: nl dentition, mucopurulent secretions / bilateral turbinates, and orophanx. Nl external ear canals without cough reflex   NECK :  without  JVD/Nodes/TM/ nl carotid upstrokes bilaterally   LUNGS: no acc muscle use,   Clear bilaterally    CV:  RRR  no s3 or murmur or increase in P2, no edema   ABD:  soft and nontender with nl excursion in the supine position. No bruits or organomegaly, bowel sounds nl  MS:  warm without deformities, calf tenderness, cyanosis or clubbing  SKIN: warm and dry without lesions    NEURO:  alert, approp, no deficits    07/23/13  No active dz per radiology report     Assessment & Plan:

## 2014-04-22 NOTE — Patient Instructions (Addendum)
When you finish the dulera 200 try change to dulera 100   Augmentin 875 mg take one pill twice daily  X 10 days - take at breakfast and supper with large glass of water.  It would help reduce the usual side effects (diarrhea and yeast infections) if you ate cultured yogurt at lunch. Refill if not 100% better   Saline nasal spray and humidifier   Prednisone 10 mg take  4 each am x 2 days,   2 each am x 2 days,  1 each am x 2 days and stop   For cough > mucinex dm 1200 mg every 12 hours  Only use your albuterol as a rescue medication to be used if you can't catch your breath by resting or doing a relaxed purse lip breathing pattern.  - The less you use it, the better it will work when you need it. - Ok to use up to 2 puffs  every 4 hours if you must but call for immediate appointment if use goes up over your usual need - Don't leave home without it !!  (think of it like the spare tire for your car)   Please schedule a follow up office visit in 6 weeks, call sooner if needed

## 2014-04-25 ENCOUNTER — Encounter: Payer: Self-pay | Admitting: Internal Medicine

## 2014-04-25 NOTE — Assessment & Plan Note (Signed)
-   trial of dulera 100 Take 2 bid 09/09/13 > ? Still needing saba with activity but nl pft's p taking saba am 10/13/2013 >  dulera 200 2 bid trial  - flared on dulera 200 with mostly upper airway symptoms > rechallenge with dulera 100 2bid  - 04/22/2014 p extensive coaching HFA effectiveness =    75%   DDX of  difficult airways management all start with A and  include Adherence, Ace Inhibitors, Acid Reflux, Active Sinus Disease, Alpha 1 Antitripsin deficiency, Anxiety masquerading as Airways dz,  ABPA,  allergy(esp in young), Aspiration (esp in elderly), Adverse effects of DPI,  Active smokers, plus two Bs  = Bronchiectasis and Beta blocker use..and one C= CHF  Adherence is always the initial "prime suspect" and is a multilayered concern that requires a "trust but verify" approach in every patient - starting with knowing how to use medications, especially inhalers, correctly, keeping up with refills and understanding the fundamental difference between maintenance and prns vs those medications only taken for a very short course and then stopped and not refilled.  The proper method of use, as well as anticipated side effects, of a metered-dose inhaler are discussed and demonstrated to the patient. Improved effectiveness after extensive coaching during this visit to a level of approximately  75%  ? Allergy > Prednisone 10 mg take  4 each am x 2 days,   2 each am x 2 days,  1 each am x 2 days and stop  ? Active sinus dz > augmentin then repeat sinus ct if not better  ? Acid (or non-acid) GERD > always difficult to exclude as up to 75% of pts in some series report no assoc GI/ Heartburn symptoms> rec continue max (24h)  acid suppression and diet restrictions/ reviewed       Each maintenance medication was reviewed in detail including most importantly the difference between maintenance and as needed and under what circumstances the prns are to be used.  Please see instructions for details which were reviewed  in writing and the patient given a copy.

## 2014-06-04 ENCOUNTER — Telehealth: Payer: Self-pay | Admitting: Internal Medicine

## 2014-06-04 ENCOUNTER — Ambulatory Visit (INDEPENDENT_AMBULATORY_CARE_PROVIDER_SITE_OTHER): Payer: BLUE CROSS/BLUE SHIELD | Admitting: Internal Medicine

## 2014-06-04 ENCOUNTER — Encounter: Payer: Self-pay | Admitting: Internal Medicine

## 2014-06-04 VITALS — BP 114/80 | HR 93 | Temp 98.3°F | Ht 69.0 in | Wt 250.0 lb

## 2014-06-04 DIAGNOSIS — J45991 Cough variant asthma: Secondary | ICD-10-CM

## 2014-06-04 DIAGNOSIS — D509 Iron deficiency anemia, unspecified: Secondary | ICD-10-CM

## 2014-06-04 DIAGNOSIS — J0191 Acute recurrent sinusitis, unspecified: Secondary | ICD-10-CM

## 2014-06-04 MED ORDER — PREDNISONE 10 MG PO TABS: ORAL_TABLET | ORAL | Status: DC

## 2014-06-04 MED ORDER — MONTELUKAST SODIUM 10 MG PO TABS: 10.0000 mg | ORAL_TABLET | Freq: Every day | ORAL | Status: DC

## 2014-06-04 NOTE — Assessment & Plan Note (Addendum)
trial of dulera 100 Take 2 bid 09/09/13 > ? Still needing saba with activity but nl pft's p taking saba am 10/13/2013 >  dulera 200 2 bid trial  - flared on dulera 200 with mostly upper airway symptoms > rechallenge with dulera 100 2bid  - allergy profile 06/04/2014  - singulair trial 06/04/2014  - 06/04/2014  p extensive coaching HFA effectiveness =    75%    Still not convinced this is asthma but needs full court press to get off saba at this point so empirically added singulair duler 200 and continue gerd rx while pursuing alternative dx like UACS triggered  By sinusitis   The ultimate goal here is to work on shortening her list of meds once we have her on effective therapy

## 2014-06-04 NOTE — Patient Instructions (Signed)
Add singulair 10 mg each evening to your current meds  Please see patient coordinator before you leave today  to schedule sinus ct for new week, not sooner  Prednisone 10 mg take  4 each am x 2 days,   2 each am x 2 days,  1 each am x 2 days and stop  Please schedule a follow up office visit in 4 weeks, sooner if needed

## 2014-06-04 NOTE — Assessment & Plan Note (Signed)
See sinus CT [facility] 09/10/2013 > acute and chronic sinusitis with af level R max sinus>>  augmentin x 21 d - repeat sinus ct 06/08/14 p completing 20 days augmentin >>>

## 2014-06-04 NOTE — Progress Notes (Signed)
Subjective:    Patient ID: Sabrina Welch, female    DOB: 1970-11-22    MRN: [id number]    Brief patient profile:   43 yowf quit smoking 2006 with one admit immediately post partum in 2007 for resp distress  required 02 in ICU>>> 100% better except for once or twice a year typically gets  head cold that moves down to chest several  with much worse cough sob since Oct 2014 not directly related to "head cold" self referred to pulmonary clinic 09/09/2013.   History of Present Illness  09/09/2013 1st [facility] office visit/ Annalysa Mohammad  Chief Complaint  Patient presents with  . Pulmonary Consult    Self referral. Pt c/o cough, SOB, and wheezing for the past 6 days. Her cough is prod with moderate yellow sputum.    worse at hs  Transiently better in past with abx including levaquin/augmentin Mostly sob with cough some better with proair  rec Dulera 100 Take 2 puffs first thing in am and then another 2 puffs about 12 hours later.  sinus CT  Change nexium 40 mg Take 30- 60 min before your first and last meals of the day  GERD  Diet  Prevnar today Late add sinus CT POS  5/13 > augmentin x 21 d and Prednisone 10 mg take  4 each am x 2 days,   2 each am x 2 days,  1 each am x 2 days and stop    10/13/2013 f/u  Pulmonary office visit/ Birdella Sippel on dulera 100 2bid Chief Complaint  Patient presents with  . Cough    PFT done today-- cough has improved since last OV- Still has some sob with exertion, and cough non-productive but imoproved  Not really clear whether it's resting or saba that helps her doe more but only occurs with inclines/ steps rec Increase dulera 200 Take 2 puffs first thing in am and then another 2 puffs about 12 hours later.  Only use your albuterol (proair) as a rescue medication  Weight control    04/22/2014 f/u ov/Cambry Spampinato re: asthma flare since 04/09/14  Chief Complaint  Patient presents with  . Acute Visit    Pt c/o increased SOB and wheezing- was seen by UC on 04/07/14  and was given neb tx, kenalog inj, abx and pred taper. Her symptoms improved but symptoms started back x 2 days ago.    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 Follow-up    Pt states that her breathing was better, then worse, then better again for the past wk. She is using her proair 1-2 x per day. She still c/o nasal congestion and has non prod cough.   finishing up 20 days of augmentin   Last saba 4 h prior  to OV  And starting to "feel tight" again, says can hear her wheezing across the room when it's bad   No obvious day to day or daytime variabilty or assoc     cp or chest tightness, subjective wheeze or overt   hb symptoms. No unusual exp hx or h/o childhood pna/ asthma or knowledge of premature birth.  Sleeping ok without nocturnal  or early am exacerbation  of respiratory  c/o's or need for noct saba. Also denies any obvious fluctuation of symptoms with weather or environmental changes or other aggravating or alleviating factors except as outlined above   Current Medications, Allergies, Complete Past Medical History, Past Surgical History, Family History, and Social History were reviewed in [employer] record.  ROS  The following are not active complaints unless bolded sore throat, dysphagia, dental problems, itching, sneezing,  nasal congestion or excess/ purulent secretions, ear ache,    fever, chills, sweats, unintended wt loss, pleuritic or exertional cp, hemoptysis,  orthopnea pnd or leg swelling, presyncope, palpitations, heartburn, abdominal pain, anorexia, nausea, vomiting, diarrhea  or change in bowel or urinary habits, change in stools or urine, dysuria,hematuria,  rash, arthralgias, visual complaints, headache, numbness weakness or ataxia or problems with walking or coordination,  change in mood/affect or memory.                      Objective:   Physical Exam  amb wf nad  10/13/2013        209  >  04/22/2014  251 > 06/04/2014  250  Wt Readings from Last 3 Encounters:  09/09/13 201 lb (91.173 kg)  05/16/13 187 lb (84.823 kg)  09/15/11 202 lb (91.627 kg)      HEENT: nl dentition, mucopurulent secretions / bilateral turbinates, and orophanx. Nl external ear canals without cough reflex   NECK :  without JVD/Nodes/TM/ nl carotid upstrokes bilaterally   LUNGS: no acc muscle use,   Clear bilaterally    CV:  RRR  no s3 or murmur or increase in P2, no edema   ABD:  soft and nontender with nl excursion in the supine position. No bruits or organomegaly, bowel sounds nl  MS:  warm without deformities, calf tenderness, cyanosis or clubbing  SKIN: warm and dry without lesions         07/23/13  No active dz per radiology report     Assessment & Plan:

## 2014-06-04 NOTE — Telephone Encounter (Signed)
Patient is having labs drawn for Dr. Sherene SiresWert tomorrow. She is asking if Dr. Juanda ChanceBrodie will check her iron panel also. Please, advise.

## 2014-06-04 NOTE — Telephone Encounter (Signed)
Please add Iron panel to  The labs.

## 2014-06-05 ENCOUNTER — Other Ambulatory Visit (INDEPENDENT_AMBULATORY_CARE_PROVIDER_SITE_OTHER): Payer: BLUE CROSS/BLUE SHIELD

## 2014-06-05 DIAGNOSIS — D509 Iron deficiency anemia, unspecified: Secondary | ICD-10-CM

## 2014-06-05 DIAGNOSIS — J45991 Cough variant asthma: Secondary | ICD-10-CM

## 2014-06-05 LAB — CBC WITH DIFFERENTIAL/PLATELET
Basophils Absolute: 0.1 10*3/uL (ref 0.0–0.1)
Basophils Relative: 1.2 % (ref 0.0–3.0)
EOS PCT: 2.6 % (ref 0.0–5.0)
Eosinophils Absolute: 0.2 10*3/uL (ref 0.0–0.7)
HEMATOCRIT: 37.2 % (ref 36.0–46.0)
Hemoglobin: 12.7 g/dL (ref 12.0–15.0)
LYMPHS ABS: 2.2 10*3/uL (ref 0.7–4.0)
Lymphocytes Relative: 24 % (ref 12.0–46.0)
MCHC: 34.2 g/dL (ref 30.0–36.0)
MCV: 84.5 fl (ref 78.0–100.0)
Monocytes Absolute: 0.7 10*3/uL (ref 0.1–1.0)
Monocytes Relative: 8.1 % (ref 3.0–12.0)
Neutro Abs: 5.8 10*3/uL (ref 1.4–7.7)
Neutrophils Relative %: 64.1 % (ref 43.0–77.0)
Platelets: 329 10*3/uL (ref 150.0–400.0)
RBC: 4.41 Mil/uL (ref 3.87–5.11)
RDW: 13.7 % (ref 11.5–15.5)
WBC: 9 10*3/uL (ref 4.0–10.5)

## 2014-06-05 LAB — IBC PANEL
Iron: 47 ug/dL (ref 42–145)
SATURATION RATIOS: 9.6 % — AB (ref 20.0–50.0)
Transferrin: 348 mg/dL (ref 212.0–360.0)

## 2014-06-05 NOTE — Telephone Encounter (Signed)
Per pt her  Ins says she has to be scheduled for CTs through [facility].  Per call to [facility], they schedule the appts for the pts at [facility].  Pt is aware Sabrina Welch

## 2014-06-05 NOTE — Telephone Encounter (Signed)
Patient notified that lab has been ordered

## 2014-06-08 ENCOUNTER — Telehealth: Payer: Self-pay | Admitting: *Deleted

## 2014-06-08 LAB — ALLERGY FULL PROFILE
Aspergillus fumigatus, m3: <0.1 kU/L
Bahia Grass: <0.1 kU/L
Bermuda Grass: <0.1 kU/L
Box Elder IgE: <0.1 kU/L
Cat Dander: <0.1 kU/L
Common Ragweed: <0.1 kU/L
D. farinae: <0.1 kU/L
Elm IgE: <0.1 kU/L
Fescue: <0.1 kU/L
G005 Rye, Perennial: <0.1 kU/L
G009 Red Top: <0.1 kU/L
Goldenrod: <0.1 kU/L
Helminthosporium halodes: <0.1 kU/L
IgE (Immunoglobulin E), Serum: 10 kU/L (ref <115)
Lamb's Quarters: <0.1 kU/L
Plantain: <0.1 kU/L
Stemphylium Botryosum: <0.1 kU/L
Sycamore Tree: <0.1 kU/L

## 2014-06-08 NOTE — Telephone Encounter (Signed)
-----   Message from Nyoka CowdenMichael B Wert, MD sent at 06/08/2014  1:34 PM EST ----- Neg allergy testing

## 2014-06-08 NOTE — Telephone Encounter (Signed)
Spoke with pt and notified of results per Dr. Wert. Pt verbalized understanding and denied any questions. 

## 2014-06-08 NOTE — Progress Notes (Signed)
Quick Note:  Spoke with pt and notified of results per Dr. Wert. Pt verbalized understanding and denied any questions.  ______ 

## 2014-06-09 ENCOUNTER — Other Ambulatory Visit: Payer: Self-pay | Admitting: *Deleted

## 2014-06-09 DIAGNOSIS — D509 Iron deficiency anemia, unspecified: Secondary | ICD-10-CM

## 2014-06-10 ENCOUNTER — Encounter: Payer: Self-pay | Admitting: Internal Medicine

## 2014-06-10 ENCOUNTER — Telehealth: Payer: Self-pay | Admitting: Internal Medicine

## 2014-06-10 NOTE — Telephone Encounter (Signed)
Pt aware of results 

## 2014-06-10 NOTE — Telephone Encounter (Signed)
[phone] calling back

## 2014-06-10 NOTE — Telephone Encounter (Signed)
Per [facility] CT Sinus is normal  LMTCB on home and mobile

## 2014-06-10 NOTE — Telephone Encounter (Signed)
Dr. Sherene SiresWert please advise if you have any further suggestions for pt's nasal and sinus congestion.  Thank you!

## 2014-06-16 ENCOUNTER — Ambulatory Visit (HOSPITAL_COMMUNITY): Payer: BLUE CROSS/BLUE SHIELD

## 2014-06-17 ENCOUNTER — Encounter (HOSPITAL_COMMUNITY)
Admission: RE | Admit: 2014-06-17 | Discharge: 2014-06-17 | Disposition: A | Discharge status: Home / Self Care | Payer: BLUE CROSS/BLUE SHIELD | Source: Ambulatory Visit | Attending: Internal Medicine | Admitting: Internal Medicine

## 2014-06-17 ENCOUNTER — Encounter (HOSPITAL_COMMUNITY): Payer: Self-pay

## 2014-06-17 VITALS — BP 112/69 | HR 99 | Temp 99.0°F | Resp 16 | Ht 69.0 in | Wt 250.0 lb

## 2014-06-17 DIAGNOSIS — D509 Iron deficiency anemia, unspecified: Secondary | ICD-10-CM | POA: Insufficient documentation

## 2014-06-17 HISTORY — DX: Anemia, unspecified: D64.9

## 2014-06-17 MED ORDER — DIPHENHYDRAMINE HCL 50 MG/ML IJ SOLN
25.0000 mg | Freq: Once | INTRAMUSCULAR | Status: AC
  Administered 2014-06-17: 25 mg via INTRAVENOUS
  Filled 2014-06-17: qty 1

## 2014-06-17 MED ORDER — METHYLPREDNISOLONE SODIUM SUCC 40 MG IJ SOLR
20.0000 mg | Freq: Once | INTRAMUSCULAR | Status: AC
  Administered 2014-06-17: 20 mg via INTRAVENOUS
  Filled 2014-06-17: qty 1

## 2014-06-17 MED ORDER — SODIUM CHLORIDE 0.9 % IV SOLN
510.0000 mg | Freq: Once | INTRAVENOUS | Status: AC
  Administered 2014-06-17: 510 mg via INTRAVENOUS
  Filled 2014-06-17: qty 17

## 2014-06-17 MED ORDER — SODIUM CHLORIDE 0.9 % IV SOLN
Freq: Once | INTRAVENOUS | Status: AC
  Administered 2014-06-17: 250 mL via INTRAVENOUS

## 2014-06-17 NOTE — Progress Notes (Signed)
Uneventful infusion of Faraheme 510 mg after giving premeds of Benadryl 25mg IV and Solumedrol 20mg  IV. Pt is scheduled for 2nd infusion in this series on 06/22/14 with premeds.

## 2014-06-17 NOTE — Discharge Instructions (Signed)
FERAHEME °Ferumoxytol injection °What is this medicine? °FERUMOXYTOL is an iron complex. Iron is used to make healthy red blood cells, which carry oxygen and nutrients throughout the body. This medicine is used to treat iron deficiency anemia in people with chronic kidney disease. °This medicine may be used for other purposes; ask your health care provider or pharmacist if you have questions. °COMMON BRAND NAME(S): Feraheme °What should I tell my health care provider before I take this medicine? °They need to know if you have any of these conditions: °-anemia not caused by low iron levels °-high levels of iron in the blood °-magnetic resonance imaging (MRI) test scheduled °-an unusual or allergic reaction to iron, other medicines, foods, dyes, or preservatives °-pregnant or trying to get pregnant °-breast-feeding °How should I use this medicine? °This medicine is for injection into a vein. It is given by a health care professional in a hospital or clinic setting. °Talk to your pediatrician regarding the use of this medicine in children. Special care may be needed. °Overdosage: If you think you've taken too much of this medicine contact a poison control center or emergency room at once. °Overdosage: If you think you have taken too much of this medicine contact a poison control center or emergency room at once. °NOTE: This medicine is only for you. Do not share this medicine with others. °What if I miss a dose? °It is important not to miss your dose. Call your doctor or health care professional if you are unable to keep an appointment. °What may interact with this medicine? °This medicine may interact with the following medications: °-other iron products °This list may not describe all possible interactions. Give your health care provider a list of all the medicines, herbs, non-prescription drugs, or dietary supplements you use. Also tell them if you smoke, drink alcohol, or use illegal drugs. Some items may interact  with your medicine. °What should I watch for while using this medicine? °Visit your doctor or healthcare professional regularly. Tell your doctor or healthcare professional if your symptoms do not start to get better or if they get worse. You may need blood work done while you are taking this medicine. °You may need to follow a special diet. Talk to your doctor. Foods that contain iron include: whole grains/cereals, dried fruits, beans, or peas, leafy green vegetables, and organ meats (liver, kidney). °What side effects may I notice from receiving this medicine? °Side effects that you should report to your doctor or health care professional as soon as possible: °-allergic reactions like skin rash, itching or hives, swelling of the face, lips, or tongue °-breathing problems °-changes in blood pressure °-feeling faint or lightheaded, falls °-fever or chills °-flushing, sweating, or hot feelings °-swelling of the ankles or feet °Side effects that usually do not require medical attention (Report these to your doctor or health care professional if they continue or are bothersome.): °-diarrhea °-headache °-nausea, vomiting °-stomach pain °This list may not describe all possible side effects. Call your doctor for medical advice about side effects. You may report side effects to FDA at 1-800-FDA-1088. °Where should I keep my medicine? °This drug is given in a hospital or clinic and will not be stored at home. °NOTE: This sheet is a summary. It may not cover all possible information. If you have questions about this medicine, talk to your doctor, pharmacist, or health care provider. °© 2015, [employer]vier/[employer]. (2011-12-01 15:23:36 °Anemia, Nonspecific °Anemia is a condition in which the concentration of red blood   cells or hemoglobin in the blood is below normal. Hemoglobin is a substance in red blood cells that carries oxygen to the tissues of the body. Anemia results in not enough oxygen reaching these tissues.    °CAUSES  °Common causes of anemia include:  °· Excessive bleeding. Bleeding may be internal or external. This includes excessive bleeding from periods (in women) or from the intestine.   °· Poor nutrition.   °· Chronic kidney, thyroid, and liver disease.  °· Bone marrow disorders that decrease red blood cell production. °· Cancer and treatments for cancer. °· HIV, AIDS, and their treatments. °· Spleen problems that increase red blood cell destruction. °· Blood disorders. °· Excess destruction of red blood cells due to infection, medicines, and autoimmune disorders. °SIGNS AND SYMPTOMS  °· Minor weakness.   °· Dizziness.   °· Headache. °· Palpitations.   °· Shortness of breath, especially with exercise.   °· Paleness. °· Cold sensitivity. °· Indigestion. °· Nausea. °· Difficulty sleeping. °· Difficulty concentrating. °Symptoms may occur suddenly or they may develop slowly.  °DIAGNOSIS  °Additional blood tests are often needed. These help your health care provider determine the best treatment. Your health care provider will check your stool for blood and look for other causes of blood loss.  °TREATMENT  °Treatment varies depending on the cause of the anemia. Treatment can include:  °· Supplements of iron, vitamin B12, or folic acid.   °· Hormone medicines.   °· A blood transfusion. This may be needed if blood loss is severe.   °· Hospitalization. This may be needed if there is significant continual blood loss.   °· Dietary changes. °· Spleen removal. °HOME CARE INSTRUCTIONS °Keep all follow-up appointments. It often takes many weeks to correct anemia, and having your health care provider check on your condition and your response to treatment is very important. °SEEK IMMEDIATE MEDICAL CARE IF:  °· You develop extreme weakness, shortness of breath, or chest pain.   °· You become dizzy or have trouble concentrating. °· You develop heavy vaginal bleeding.   °· You develop a rash.   °· You have bloody or black, tarry  stools.   °· You faint.   °· You vomit up blood.   °· You vomit repeatedly.   °· You have abdominal pain. °· You have a fever or persistent symptoms for more than 2-3 days.   °· You have a fever and your symptoms suddenly get worse.   °· You are dehydrated.   °MAKE SURE YOU: °· Understand these instructions. °· Will watch your condition. °· Will get help right away if you are not doing well or get worse. °Document Released: 05/25/2004 Document Revised: 12/18/2012 Document Reviewed: 10/11/2012 °ExitCare® Patient Information ©2015 [employer]. This information is not intended to replace advice given to you by your health care provider. Make sure you discuss any questions you have with your health care provider. °) ° °

## 2014-06-22 ENCOUNTER — Other Ambulatory Visit (HOSPITAL_COMMUNITY): Payer: Self-pay | Admitting: Internal Medicine

## 2014-06-22 ENCOUNTER — Encounter (HOSPITAL_COMMUNITY): Payer: Self-pay

## 2014-06-22 ENCOUNTER — Encounter (HOSPITAL_COMMUNITY)
Admission: RE | Admit: 2014-06-22 | Discharge: 2014-06-22 | Disposition: A | Discharge status: Home / Self Care | Payer: BLUE CROSS/BLUE SHIELD | Source: Ambulatory Visit | Attending: Internal Medicine | Admitting: Internal Medicine

## 2014-06-22 VITALS — BP 113/68 | HR 91 | Temp 98.3°F | Resp 14

## 2014-06-22 DIAGNOSIS — D509 Iron deficiency anemia, unspecified: Secondary | ICD-10-CM | POA: Diagnosis not present

## 2014-06-22 MED ORDER — DIPHENHYDRAMINE HCL 50 MG/ML IJ SOLN
25.0000 mg | Freq: Once | INTRAMUSCULAR | Status: AC
  Administered 2014-06-22: 25 mg via INTRAVENOUS
  Filled 2014-06-22: qty 1

## 2014-06-22 MED ORDER — SODIUM CHLORIDE 0.9 % IV SOLN
510.0000 mg | Freq: Once | INTRAVENOUS | Status: AC
  Administered 2014-06-22: 510 mg via INTRAVENOUS
  Filled 2014-06-22: qty 17

## 2014-06-22 MED ORDER — METHYLPREDNISOLONE SODIUM SUCC 40 MG IJ SOLR
20.0000 mg | Freq: Once | INTRAMUSCULAR | Status: AC
  Administered 2014-06-22: 20 mg via INTRAVENOUS
  Filled 2014-06-22: qty 1

## 2014-06-22 MED ORDER — SODIUM CHLORIDE 0.9 % IV SOLN
Freq: Once | INTRAVENOUS | Status: AC
  Administered 2014-06-22: 11:00:00 via INTRAVENOUS

## 2014-06-22 NOTE — Progress Notes (Signed)
2nd feraheme infusion tolerated well.  Premeds were given before infusion, benadryl and solumedrol.  Pt vss, afebrile. Pt d/c home ambulatory.

## 2014-06-23 DIAGNOSIS — D509 Iron deficiency anemia, unspecified: Secondary | ICD-10-CM | POA: Diagnosis not present

## 2014-07-03 ENCOUNTER — Encounter: Payer: Self-pay | Admitting: Internal Medicine

## 2014-07-03 ENCOUNTER — Ambulatory Visit (INDEPENDENT_AMBULATORY_CARE_PROVIDER_SITE_OTHER): Payer: BLUE CROSS/BLUE SHIELD | Admitting: Internal Medicine

## 2014-07-03 VITALS — BP 122/90 | HR 112 | Ht 69.5 in | Wt 253.0 lb

## 2014-07-03 DIAGNOSIS — R0600 Dyspnea, unspecified: Secondary | ICD-10-CM

## 2014-07-03 DIAGNOSIS — J45991 Cough variant asthma: Secondary | ICD-10-CM

## 2014-07-03 DIAGNOSIS — J0181 Other acute recurrent sinusitis: Secondary | ICD-10-CM

## 2014-07-03 MED ORDER — AZELASTINE-FLUTICASONE 137-50 MCG/ACT NA SUSP: 1.0000 | Freq: Two times a day (BID) | NASAL | Status: DC

## 2014-07-03 NOTE — Patient Instructions (Addendum)
dymista one twice daily until you use up the sample and fill the rx if helps you breath through you nose and if not we need to refer you to an ENT doctor  For drainage take chlortrimeton (chlorpheniramine) 4 mg every 4 hours available over the counter (may cause drowsiness)   Please see patient coordinator before you leave today  to schedule methacholine challenge test off dulera x 24 hours first   Please schedule a follow up office visit in 6 weeks, call sooner if needed

## 2014-07-03 NOTE — Progress Notes (Signed)
Subjective:    Patient ID: Sabrina Welch, female    DOB: 05/18/1970    MRN: [id number]    Brief patient profile:   43 yowf quit smoking 2006 with one admit immediately post partum in 2007 for resp distress  required 02 in ICU>>> 100% better except for once or twice a year typically gets  head cold that moves down to chest several  with much worse cough sob since Oct 2014 not directly related to "head cold" self referred to pulmonary clinic 09/09/2013 with dx of asthma vs uacs    History of Present Illness  09/09/2013 1st [facility] office visit/ Sabrina Welch  Chief Complaint  Patient presents with  . Pulmonary Consult    Self referral. Pt c/o cough, SOB, and wheezing for the past 6 days. Her cough is prod with moderate yellow sputum.    worse at hs  Transiently better in past with abx including levaquin/augmentin Mostly sob with cough some better with proair  rec Dulera 100 Take 2 puffs first thing in am and then another 2 puffs about 12 hours later.  sinus CT  Change nexium 40 mg Take 30- 60 min before your first and last meals of the day  GERD  Diet  Prevnar today Late add sinus CT POS  5/13 > augmentin x 21 d and Prednisone 10 mg take  4 each am x 2 days,   2 each am x 2 days,  1 each am x 2 days and stop    10/13/2013 f/u  Pulmonary office visit/ Sabrina Welch on dulera 100 2bid Chief Complaint  Patient presents with  . Cough    PFT done today-- cough has improved since last OV- Still has some sob with exertion, and cough non-productive but imoproved  Not really clear whether it's resting or saba that helps her doe more but only occurs with inclines/ steps rec Increase dulera 200 Take 2 puffs first thing in am and then another 2 puffs about 12 hours later.  Only use your albuterol (proair) as a rescue medication  Weight control    04/22/2014 f/u ov/Sabrina Welch re: asthma flare since 04/09/14  Chief Complaint  Patient presents with  . Acute Visit    Pt c/o increased SOB and wheezing-  was seen by UC on 04/07/14 and was given neb tx, kenalog inj, abx and pred taper. Her symptoms improved but symptoms started back x 2 days ago.    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 Follow-up    Pt states that her breathing was better, then worse, then better again for the past wk. She is using her proair 1-2 x per day. She still c/o nasal congestion and has non prod cough.   finishing up 20 days of augmentin  Last saba 4 h prior to OV  And starting to "feel tight" again, says can hear her wheezing across the room when it's bad  rec Add singulair 10 mg each evening to your current meds Please see patient coordinator before you leave today  to schedule sinus ct for new week, not sooner Prednisone 10 mg take  4 each am x 2 days,   2 each am x 2 days,  1 each am x 2 days and stop    07/03/2014 f/u ov/Sabrina Welch [id number] vs uacs   Chief Complaint  Patient presents with  . Follow-up    Pt states that her breathing is some better since the last visit. She has started coughing up minimal yellow/brown x 2 days. She is using albuterol inhaler 1-2 x per wk on average.   lately every time walk across office get sob/ not sure albuterol helps this problem Also hs cough persists   No obvious day to day or daytime variabilty or assoc   cp or chest tightness, subjective wheeze or overt   hb symptoms. No unusual exp hx or h/o  childhood pna/ asthma or knowledge of premature birth.  Sleeping ok without nocturnal  or early am exacerbation  of respiratory  c/o's or need for noct saba. Also denies any obvious fluctuation of symptoms with weather or environmental changes or other aggravating or alleviating factors except as outlined above   Current Medications, Allergies, Complete Past Medical History, Past Surgical History, Family History, and Social History were reviewed in [employer] record.  ROS  The following are not active complaints unless bolded sore throat, dysphagia, dental problems, itching, sneezing,  nasal congestion or excess/ purulent secretions, ear ache,   fever, chills, sweats, unintended wt loss, pleuritic or exertional cp, hemoptysis,  orthopnea pnd or leg swelling, presyncope, palpitations, heartburn, abdominal pain, anorexia, nausea, vomiting, diarrhea  or change in bowel or urinary habits, change in stools or urine, dysuria,hematuria,  rash, arthralgias, visual complaints, headache, numbness weakness or ataxia or problems with walking or coordination,  change in mood/affect or memory.                      Objective:   Physical Exam  amb wf nad  10/13/2013        209  >  04/22/2014  251 > 06/04/2014  250 >  07/03/2014   253   Wt Readings from Last 3 Encounters:  09/09/13 201 lb (91.173 kg)  05/16/13 187 lb (84.823 kg)  09/15/11 202 lb (91.627 kg)      HEENT: nl dentition, mucopurulent secretions / bilateral turbinates, and orophanx. Nl external ear canals without cough reflex   NECK :  without JVD/Nodes/TM/ nl carotid upstrokes bilaterally   LUNGS: no acc muscle use,   Clear bilaterally    CV:  RRR  no s3 or murmur or increase in P2, no edema   ABD:  soft and nontender with nl excursion in the supine position. No bruits or organomegaly, bowel sounds nl  MS:  warm without deformities, calf tenderness, cyanosis or clubbing  SKIN: warm and dry without lesions          07/23/13  No active dz per radiology report     Assessment & Plan:   Outpatient Encounter Prescriptions as of 07/03/2014  Medication Sig  . albuterol (PROAIR HFA) 108 (90 BASE) MCG/ACT inhaler Inhale 2 puffs into the lungs every 6 (six) hours as needed for wheezing or  shortness of breath.  Marland Kitchen buPROPion (WELLBUTRIN SR) 200 MG 12 hr tablet Take 200 mg by mouth 2 (two) times daily.  . CYMBALTA 30 MG capsule Takes 2 capsules by mouth once a day  . dicyclomine (BENTYL) 10 MG capsule Take 10 mg by mouth 2 (two) times daily as needed.  . diphenoxylate-atropine (LOMOTIL) 2.5-0.025 MG per tablet Take one po TID prn diarrhea  . esomeprazole (NEXIUM) 40 MG capsule Take 1 capsule (40 mg total) by mouth 2 (two) times daily.  Marland Kitchen LORazepam (ATIVAN) 1 MG tablet Take 1 tablet by mouth daily as needed.   . mometasone-formoterol (DULERA) 200-5 MCG/ACT AERO Take 2 puffs first thing in am and then another 2 puffs about 12 hours later.  . montelukast (SINGULAIR) 10 MG tablet Take 1 tablet (10 mg total) by mouth at bedtime.  . ondansetron (ZOFRAN ODT) 4 MG disintegrating tablet Take 1 tablet (4 mg total) by mouth every 8 (eight) hours as needed for nausea or vomiting.  . promethazine (PHENERGAN) 25 MG tablet Take 1/2 tablet by mouth every 6 hours as needed  . zolpidem (AMBIEN) 10 MG tablet 1 tablet every evening.  . [DISCONTINUED] Norethin-Eth Estrad-Fe Biphas (LO LOESTRIN FE PO) Take 1 tablet by mouth every morning.  . Azelastine-Fluticasone (DYMISTA) 137-50 MCG/ACT SUSP Place 1 puff into the nose 2 (two) times daily.  . [DISCONTINUED] Azelastine-Fluticasone (DYMISTA) 137-50 MCG/ACT SUSP Place 1 puff into the nose 2 (two) times daily.  . [DISCONTINUED] LO LOESTRIN FE 1 MG-10 MCG / 10 MCG tablet As directed  . [DISCONTINUED] predniSONE (DELTASONE) 10 MG tablet Take  4 each am x 2 days,   2 each am x 2 days,  1 each am x 2 days and stop

## 2014-07-04 ENCOUNTER — Encounter: Payer: Self-pay | Admitting: Internal Medicine

## 2014-07-04 NOTE — Assessment & Plan Note (Signed)
-   pfts nl 10/13/13 - 07/03/2014  Walked RA x 3 laps @ 185 ft each stopped due to end of study, moderate/fast pace, no desat   - 07/03/2014 spirometry wnl    Unable to reproduce her daily sob walking across the office and strongly suspect this is anxiety related

## 2014-07-04 NOTE — Assessment & Plan Note (Signed)
See sinus CT [facility] 09/10/2013 > acute and chronic sinusitis with af level R max sinus>>  augmentin x 21 d - repeat sinus ct 06/08/14 p completing 20 days augmentin > 06/08/14 ct clear  Starting back up with hs cough suggesting more sinus dz but will hold off on further abx so as not to muddy the water pending MCT

## 2014-07-04 NOTE — Assessment & Plan Note (Addendum)
-   trial of dulera 100 Take 2 bid 09/09/13 > ? Still needing saba with activity but nl pft's p taking saba am 10/13/2013 >  dulera 200 2 bid trial  - flared on dulera 200 with mostly upper airway symptoms > rechallenge with dulera 100 2bid > preferred the 200 dose - allergy profile 06/04/2014 > IgE 10, neg RAST - singulair trial 06/04/2014  - 06/04/2014  p extensive coaching HFA effectiveness =    75%  - MCT 07/03/2014 >>>   I had an extended discussion with the patient reviewing all relevant studies completed to date and  lasting 15 to 20 minutes of a 25 minute visit on the following ongoing concerns:  Symptoms are markedly disproportionate to objective findings and not clear this is a lung problem but pt does appear to have difficult airway management issues.  DDX of  difficult airways management all start with A and  include Adherence, Ace Inhibitors, Acid Reflux, Active Sinus Disease, Alpha 1 Antitripsin deficiency, Anxiety masquerading as Airways dz,  ABPA,  allergy(esp in young), Aspiration (esp in elderly), Adverse effects of DPI,  Active smokers, plus two Bs  = Bronchiectasis and Beta blocker use..and one C= CHF   ? Acid (or non-acid) GERD > always difficult to exclude as up to 75% of pts in some series report no assoc GI/ Heartburn symptoms> rec continue max (24h)  acid suppression and diet restrictions/ reviewed and instructions given in writing.   ? Active sinus dz > see UACS  ? Allergy/asthma > very unimpressive RAST/ needs MCT next / continue singulair for now but low threshold to dc esp if MCT neg  ? Anxiety > dx of exclusion but note already on multiple psychotropics so should probably be higher on the list.    See instructions for specific recommendations which were reviewed directly with the patient who was given a copy with highlighter outlining the key components.

## 2014-07-08 ENCOUNTER — Telehealth: Payer: Self-pay | Admitting: Internal Medicine

## 2014-07-08 ENCOUNTER — Encounter: Payer: Self-pay | Admitting: Internal Medicine

## 2014-07-08 ENCOUNTER — Ambulatory Visit (INDEPENDENT_AMBULATORY_CARE_PROVIDER_SITE_OTHER): Payer: BLUE CROSS/BLUE SHIELD | Admitting: Internal Medicine

## 2014-07-08 VITALS — BP 106/80 | HR 90 | Temp 98.1°F | Ht 70.0 in | Wt 257.0 lb

## 2014-07-08 DIAGNOSIS — J45991 Cough variant asthma: Secondary | ICD-10-CM

## 2014-07-08 MED ORDER — PREDNISONE 10 MG PO TABS: ORAL_TABLET | ORAL | Status: DC

## 2014-07-08 MED ORDER — AMOXICILLIN-POT CLAVULANATE 875-125 MG PO TABS: 1.0000 | ORAL_TABLET | Freq: Two times a day (BID) | ORAL | Status: DC

## 2014-07-08 MED ORDER — LEVALBUTEROL HCL 0.63 MG/3ML IN NEBU
0.6300 mg | INHALATION_SOLUTION | Freq: Once | RESPIRATORY_TRACT | Status: AC
  Administered 2014-07-08: 0.63 mg via RESPIRATORY_TRACT

## 2014-07-08 NOTE — Telephone Encounter (Signed)
Per [name] - [name].  Sabrina Welch is going to add the pt. Pt is aware that we are going to see her. Nothing further was needed.

## 2014-07-08 NOTE — Progress Notes (Signed)
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 Pulmonary Consult    Self referral. Pt c/o cough, SOB, and wheezing for the past 6 days. Her cough is prod with moderate yellow sputum.    worse at hs  Transiently better in past with abx including levaquin/augmentin Mostly sob with cough some better with proair  rec Dulera 100 Take 2 puffs first thing in am and then another 2 puffs about 12 hours later.  sinus CT  Change nexium 40 mg Take 30- 60 min before your first and last meals of the day  GERD  Diet  Prevnar today Late add sinus CT POS  5/13 > augmentin x 21 d and Prednisone 10 mg take  4 each am x 2 days,   2 each am x 2 days,  1 each am x 2 days and stop    10/13/2013 f/u  Pulmonary office visit/ Makenze Ellett on dulera 100 2bid Chief Complaint  Patient presents with  . Cough    PFT done today-- cough has improved since last OV- Still has some sob with exertion, and cough non-productive but imoproved  Not really clear whether it's resting or saba that helps her doe more but only occurs with inclines/ steps rec Increase dulera 200 Take 2 puffs first thing in am and then another 2 puffs about 12 hours later.  Only use your albuterol (proair) as a rescue medication  Weight control    04/22/2014 f/u ov/Vieva Brummitt re: asthma flare since 04/09/14  Chief Complaint  Patient presents with  . Acute Visit   Pt c/o increased SOB and wheezing- was seen by UC on 04/07/14 and was given neb tx, kenalog inj, abx and pred taper. Her symptoms improved but symptoms started back x 2 days ago.    already given levaquin  Acute onset x 2 weeks with persistent nasal congestion then worse x 2 days / not clear she's taking dulera consistently  rec When you finish the dulera 200 try change to dulera 100  Augmentin 875 mg take one pill twice daily  X 10 days -Refill if not 100% better  Saline nasal spray and humidifier  Prednisone 10 mg take  4 each am x 2 days,   2 each am x 2 days,  1 each am x 2 days and stop  For cough > mucinex dm 1200 mg every 12 hours Only use your albuterol as a rescue medication   06/04/2014 f/u ov/Mallorie Norrod re: asthma vs pseudoasthma on dulera 200 2bid  Chief Complaint  Patient presents with  . Follow-up    Pt states that her breathing was better, then worse, then better again for the past wk. She is using her proair 1-2 x per day. She still c/o nasal congestion and has non prod cough.   finishing up  20 days of augmentin   Last saba 4 h prior to OV  And starting to "feel tight" again, says can hear her wheezing across the room when it's bad  rec Add singulair 10 mg each evening to your current meds Please see patient coordinator before you leave today  to schedule sinus ct for next week, not sooner Prednisone 10 mg take  4 each am x 2 days,   2 each am x 2 days,  1 each am x 2 days and stop    07/03/2014 f/u ov/Neils Siracusa [id number]:asthma vs uacs   Chief Complaint  Patient presents with  . Follow-up    Pt states that her breathing is some better since the last visit. She has started coughing up minimal yellow/brown x 2 days. She is using albuterol inhaler 1-2 x per wk on average.   lately every time walk across office get sob/ not sure albuterol helps this problem Also hs cough persists  rec dymista one twice daily until you use up the sample and fill the rx if helps you breath through you nose and  if not we need to refer you to an ENT doctor For drainage take chlortrimeton (chlorpheniramine) 4 mg every 4 hours available over the counter (may cause drowsiness)  Please see patient coordinator before you leave today  to schedule methacholine challenge test off dulera x 24 hours first    07/08/2014 acute ov/Hawa Henly re: sob/ cough/ wheeze Chief Complaint  Patient presents with  . Acute Visit    Pt c/o increased wheezing x 2 days- has used rescue inhaler x 2 today with minimal relief. She is also c/o more SOB than usual.  Her cough has been prod with brown sputum for the past 2 days.    Misunderstood and stopped dulera p last ov and gradually worse since then    No obvious day to day or daytime variabilty or assoc   cp or chest tightness, subjective wheeze or overt   hb symptoms. No unusual exp hx or h/o childhood pna/ asthma or knowledge of premature birth.  Sleeping ok without nocturnal  or early am exacerbation  of respiratory  c/o's or need for noct saba. Also denies any obvious fluctuation of symptoms with weather or environmental changes or other aggravating or alleviating factors except as outlined above   Current Medications, Allergies, Complete Past Medical History, Past Surgical History, Family History, and Social History were reviewed in [facility] electronic medical record.  ROS  The following are not active complaints unless bolded sore throat, dysphagia, dental problems, itching, sneezing,  nasal congestion or excess/ purulent secretions, ear ache,   fever, chills, sweats, unintended wt loss, pleuritic or exertional cp, hemoptysis,  orthopnea pnd or leg swelling, presyncope, palpitations, heartburn, abdominal pain, anorexia, nausea, vomiting, diarrhea  or change in bowel or urinary habits, change in stools or urine, dysuria,hematuria,  rash, arthralgias, visual complaints, headache, numbness weakness or ataxia or problems with walking or coordination,  change in mood/affect or  memory.                      Objective:   Physical Exam  amb wf nad  10/13/2013        209  >  04/22/2014  251 > 06/04/2014  250 >  07/03/2014   253  > 07/08/2014  257  Wt Readings from Last 3 Encounters:  09/09/13 201 lb (91.173 kg)  05/16/13 187 lb (84.823 kg)  09/15/11 202 lb (  91.627 kg)      HEENT: nl dentition, mucopurulent secretions / bilateral turbinates, and orophanx. Nl external ear canals without cough reflex   NECK :  without JVD/Nodes/TM/ nl carotid upstrokes bilaterally   LUNGS: no acc muscle use,   Clear bilaterally    CV:  RRR  no s3 or murmur or increase in P2, no edema   ABD:  soft and nontender with nl excursion in the supine position. No bruits or organomegaly, bowel sounds nl  MS:  warm without deformities, calf tenderness, cyanosis or clubbing  SKIN: warm and dry without lesions         07/23/13  No active dz per radiology report     Assessment & Plan:

## 2014-07-08 NOTE — Patient Instructions (Addendum)
Augmentin 875 mg take one pill twice daily  X 10 days - take at breakfast and supper with large glass of water.  It would help reduce the usual side effects (diarrhea and yeast infections) if you ate cultured yogurt at lunch.   Prednisone 10 mg take  4 each am x 2 days,   2 each am x 2 days,  1 each am x 2 days and stop   Resume dulera 200 Take 2 puffs first thing in am and then another 2 puffs about 12 hours later.   Please schedule a follow up office visit in 6 weeks, call sooner if needed with all meds in hand

## 2014-07-09 ENCOUNTER — Encounter: Payer: Self-pay | Admitting: Internal Medicine

## 2014-07-09 ENCOUNTER — Telehealth: Payer: Self-pay | Admitting: *Deleted

## 2014-07-09 NOTE — Telephone Encounter (Signed)
Left a message for patient to call back. 

## 2014-07-09 NOTE — Assessment & Plan Note (Addendum)
-   trial of dulera 100 Take 2 bid 09/09/13 > ? Still needing saba with activity but nl pft's p taking saba am 10/13/2013 >  dulera 200 2 bid trial  - flared on dulera 200 with mostly upper airway symptoms > rechallenge with dulera 100 2bid > preferred the 200 dose - allergy profile 06/04/2014 > IgE 10, neg RAST - singulair trial 06/04/2014  - 06/04/2014  p extensive coaching HFA effectiveness =    75%  - spirometry off dulera 07/08/14 =  FEV1 down from 4.04 to 2.22(65%) with ratio 67 and clasisic obst f/v curve   I had an extended discussion with the patient reviewing all relevant studies completed to date and  lasting 15 to 20 minutes of a 25 minute visit on the following ongoing concerns:  1) no need for MCT, this is clearly asthma related deterioration off dulera > resume dulera 200 2bid  2) the issue is why she doesn't do great when her airflow has been shown to normalize on rx  3) for now should therefore continue singulair and max gerd rx   4) Rx possible sinusitis with augmentin then repeat sinus ct if not better  5) use trust but verify approach here to make sure she's doing what we ask her to do before we ask her to do more > bring all meds each ov  See instructions for specific recommendations which were reviewed directly with the patient who was given a copy with highlighter outlining the key components.

## 2014-07-09 NOTE — Telephone Encounter (Signed)
Patient will have labs done

## 2014-07-09 NOTE — Telephone Encounter (Signed)
-----   Message from Daphine Deutscheregina N Leveon Pelzer, RN sent at 06/09/2014  9:27 AM EST ----- Call and remind due for labs for DB on 07/13/14. Labs in EPIC.

## 2014-07-10 ENCOUNTER — Telehealth: Payer: Self-pay | Admitting: *Deleted

## 2014-07-10 ENCOUNTER — Ambulatory Visit (INDEPENDENT_AMBULATORY_CARE_PROVIDER_SITE_OTHER): Payer: BLUE CROSS/BLUE SHIELD | Admitting: Internal Medicine

## 2014-07-10 ENCOUNTER — Encounter (HOSPITAL_COMMUNITY): Payer: BLUE CROSS/BLUE SHIELD

## 2014-07-10 ENCOUNTER — Other Ambulatory Visit (INDEPENDENT_AMBULATORY_CARE_PROVIDER_SITE_OTHER): Payer: BLUE CROSS/BLUE SHIELD

## 2014-07-10 ENCOUNTER — Encounter: Payer: Self-pay | Admitting: Internal Medicine

## 2014-07-10 VITALS — BP 114/60 | HR 100 | Ht 68.75 in | Wt 256.0 lb

## 2014-07-10 DIAGNOSIS — D509 Iron deficiency anemia, unspecified: Secondary | ICD-10-CM

## 2014-07-10 DIAGNOSIS — K219 Gastro-esophageal reflux disease without esophagitis: Secondary | ICD-10-CM

## 2014-07-10 LAB — IBC PANEL
Iron: 57 ug/dL (ref 42–145)
Saturation Ratios: 16.6 % — ABNORMAL LOW (ref 20.0–50.0)
TRANSFERRIN: 245 mg/dL (ref 212.0–360.0)

## 2014-07-10 LAB — CBC WITH DIFFERENTIAL/PLATELET
BASOS PCT: 0.4 % (ref 0.0–3.0)
Basophils Absolute: 0 10*3/uL (ref 0.0–0.1)
Eosinophils Absolute: 0 10*3/uL (ref 0.0–0.7)
Eosinophils Relative: 0.1 % (ref 0.0–5.0)
HEMATOCRIT: 38.6 % (ref 36.0–46.0)
HEMOGLOBIN: 12.8 g/dL (ref 12.0–15.0)
Lymphocytes Relative: 6.6 % — ABNORMAL LOW (ref 12.0–46.0)
Lymphs Abs: 0.7 10*3/uL (ref 0.7–4.0)
MCHC: 33.3 g/dL (ref 30.0–36.0)
MCV: 87.8 fl (ref 78.0–100.0)
Monocytes Absolute: 0.2 10*3/uL (ref 0.1–1.0)
Monocytes Relative: 1.6 % — ABNORMAL LOW (ref 3.0–12.0)
NEUTROS ABS: 10.2 10*3/uL — AB (ref 1.4–7.7)
Neutrophils Relative %: 91.3 % — ABNORMAL HIGH (ref 43.0–77.0)
Platelets: 359 10*3/uL (ref 150.0–400.0)
RBC: 4.4 Mil/uL (ref 3.87–5.11)
RDW: 16.2 % — ABNORMAL HIGH (ref 11.5–15.5)
WBC: 11.2 10*3/uL — AB (ref 4.0–10.5)

## 2014-07-10 LAB — IRON: IRON: 57 ug/dL (ref 42–145)

## 2014-07-10 MED ORDER — ONDANSETRON 4 MG PO TBDP: 4.0000 mg | ORAL_TABLET | Freq: Three times a day (TID) | ORAL | Status: DC | PRN

## 2014-07-10 MED ORDER — DIPHENOXYLATE-ATROPINE 2.5-0.025 MG PO TABS: ORAL_TABLET | ORAL | Status: DC

## 2014-07-10 MED ORDER — DICYCLOMINE HCL 10 MG PO CAPS: 10.0000 mg | ORAL_CAPSULE | Freq: Two times a day (BID) | ORAL | Status: DC | PRN

## 2014-07-10 MED ORDER — ESOMEPRAZOLE MAGNESIUM 40 MG PO CPDR: 40.0000 mg | DELAYED_RELEASE_CAPSULE | Freq: Two times a day (BID) | ORAL | Status: DC

## 2014-07-10 NOTE — Telephone Encounter (Signed)
Patient here for capsule endo teaching. Verbalizes understanding for all written and verbal instructions. 

## 2014-07-10 NOTE — Patient Instructions (Addendum)
Dr Sherene SiresWert, Dr Kirby FunkJohn Griffin   Your physician has requested that you go to the basement for lab work before leaving today  We have sent the following medications to your pharmacy for you to pick up at your convenience:  Nexium, Lomotil, Zofran, Bentyl
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
## 2014-07-12 ENCOUNTER — Other Ambulatory Visit: Payer: Self-pay | Admitting: Internal Medicine

## 2014-07-13 LAB — GLIA (IGA/G) + TTG IGA
Gliadin IgA: 6 Units (ref <20)
Gliadin IgG: 2 Units (ref <20)
Tissue Transglutaminase Ab, IgA: 1 U/mL (ref <4)

## 2014-07-17 ENCOUNTER — Ambulatory Visit: Payer: BLUE CROSS/BLUE SHIELD | Admitting: Internal Medicine

## 2014-07-21 ENCOUNTER — Ambulatory Visit (INDEPENDENT_AMBULATORY_CARE_PROVIDER_SITE_OTHER): Payer: BLUE CROSS/BLUE SHIELD | Admitting: Gastroenterology

## 2014-07-21 DIAGNOSIS — D509 Iron deficiency anemia, unspecified: Secondary | ICD-10-CM | POA: Diagnosis not present

## 2014-07-21 NOTE — Progress Notes (Signed)
Patient here for capsule endoscopy. Tolerated procedure. Verbalizes understanding of written and verbal instructions. She agrees to return today at 4pm. Capsule lot #[id number]  Exp 2017-05

## 2014-08-11 DIAGNOSIS — D509 Iron deficiency anemia, unspecified: Secondary | ICD-10-CM | POA: Insufficient documentation

## 2014-08-12 ENCOUNTER — Encounter: Payer: Self-pay | Admitting: Internal Medicine

## 2014-08-12 ENCOUNTER — Other Ambulatory Visit: Payer: Self-pay | Admitting: *Deleted

## 2014-08-12 ENCOUNTER — Telehealth: Payer: Self-pay | Admitting: *Deleted

## 2014-08-12 DIAGNOSIS — D509 Iron deficiency anemia, unspecified: Secondary | ICD-10-CM

## 2014-08-12 NOTE — Telephone Encounter (Signed)
Patient given results and recommendations. Labs in EPIC. 

## 2014-08-12 NOTE — Telephone Encounter (Signed)
-----   Message from Hart Carwinora M Brodie, MD sent at 08/12/2014  4:20 PM EDT ----- Regarding: CBC,Iron studies Sabrina KocherRegina, please call pt with results of SBCE which showed 1 avm in the small bolw, possible source of bleeding. Please repeat CBC abd Iron studies. Dx Iron deficiency anemia. Continue Iron supplements. Consider repeating colonoscopy/enteroscopy in Hg not  Coming up.

## 2014-08-13 ENCOUNTER — Other Ambulatory Visit (INDEPENDENT_AMBULATORY_CARE_PROVIDER_SITE_OTHER): Payer: BLUE CROSS/BLUE SHIELD

## 2014-08-13 DIAGNOSIS — D509 Iron deficiency anemia, unspecified: Secondary | ICD-10-CM | POA: Diagnosis not present

## 2014-08-13 LAB — CBC WITH DIFFERENTIAL/PLATELET
BASOS ABS: 0 10*3/uL (ref 0.0–0.1)
Basophils Relative: 0.4 % (ref 0.0–3.0)
EOS ABS: 0.3 10*3/uL (ref 0.0–0.7)
Eosinophils Relative: 2.5 % (ref 0.0–5.0)
HEMATOCRIT: 41.2 % (ref 36.0–46.0)
Hemoglobin: 13.9 g/dL (ref 12.0–15.0)
LYMPHS ABS: 1.9 10*3/uL (ref 0.7–4.0)
Lymphocytes Relative: 18.3 % (ref 12.0–46.0)
MCHC: 33.7 g/dL (ref 30.0–36.0)
MCV: 89.1 fl (ref 78.0–100.0)
Monocytes Absolute: 0.8 10*3/uL (ref 0.1–1.0)
Monocytes Relative: 8.3 % (ref 3.0–12.0)
NEUTROS ABS: 7.2 10*3/uL (ref 1.4–7.7)
Neutrophils Relative %: 70.5 % (ref 43.0–77.0)
PLATELETS: 338 10*3/uL (ref 150.0–400.0)
RBC: 4.62 Mil/uL (ref 3.87–5.11)
RDW: 16.7 % — AB (ref 11.5–15.5)
WBC: 10.2 10*3/uL (ref 4.0–10.5)

## 2014-08-13 LAB — IBC PANEL
IRON: 61 ug/dL (ref 42–145)
Saturation Ratios: 17.3 % — ABNORMAL LOW (ref 20.0–50.0)
Transferrin: 252 mg/dL (ref 212.0–360.0)

## 2014-08-14 ENCOUNTER — Encounter: Payer: Self-pay | Admitting: Internal Medicine

## 2014-08-14 ENCOUNTER — Ambulatory Visit (INDEPENDENT_AMBULATORY_CARE_PROVIDER_SITE_OTHER): Payer: BLUE CROSS/BLUE SHIELD | Admitting: Internal Medicine

## 2014-08-14 ENCOUNTER — Other Ambulatory Visit: Payer: Self-pay

## 2014-08-14 VITALS — BP 130/80 | HR 101 | Ht 69.0 in | Wt 260.0 lb

## 2014-08-14 DIAGNOSIS — J45991 Cough variant asthma: Secondary | ICD-10-CM

## 2014-08-14 DIAGNOSIS — D508 Other iron deficiency anemias: Secondary | ICD-10-CM

## 2014-08-14 MED ORDER — AZELASTINE-FLUTICASONE 137-50 MCG/ACT NA SUSP: 1.0000 | Freq: Two times a day (BID) | NASAL | Status: DC

## 2014-08-14 NOTE — Patient Instructions (Addendum)
Try off singulair when you finish it  No other changes  Please schedule a follow up visit in 3 months but call sooner if needed

## 2014-08-14 NOTE — Progress Notes (Signed)
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 Pulmonary Consult    Self referral. Pt c/o cough, SOB, and wheezing for the past 6 days. Her cough is prod with moderate yellow sputum.    worse at hs  Transiently better in past with abx including levaquin/augmentin Mostly sob with cough some better with proair  rec Dulera 100 Take 2 puffs first thing in am and then another 2 puffs about 12 hours later.  sinus CT  Change nexium 40 mg Take 30- 60 min before your first and last meals of the day  GERD  Diet  Prevnar today Late add sinus CT POS  5/13 > augmentin x 21 d and Prednisone 10 mg take  4 each am x 2 days,   2 each am x 2 days,  1 each am x 2 days and stop    10/13/2013 f/u  Pulmonary office visit/ Arnold Depinto on dulera 100 2bid Chief Complaint  Patient presents with  . Cough    PFT done today-- cough has improved since last OV- Still has some sob with exertion, and cough non-productive but imoproved  Not really clear whether it's resting or saba that helps her doe more but only occurs with inclines/ steps rec Increase dulera 200 Take 2 puffs first thing in am and then another 2 puffs about 12 hours later.  Only use your albuterol (proair) as a rescue medication  Weight control    04/22/2014 f/u ov/Deeya Richeson re: asthma flare since 04/09/14  Chief Complaint  Patient presents with  . Acute Visit     Pt c/o increased SOB and wheezing- was seen by UC on 04/07/14 and was given neb tx, kenalog inj, abx and pred taper. Her symptoms improved but symptoms started back x 2 days ago.    already given levaquin  Acute onset x 2 weeks with persistent nasal congestion then worse x 2 days / not clear she's taking dulera consistently  rec When you finish the dulera 200 try change to dulera 100  Augmentin 875 mg take one pill twice daily  X 10 days -Refill if not 100% better  Saline nasal spray and humidifier  Prednisone 10 mg take  4 each am x 2 days,   2 each am x 2 days,  1 each am x 2 days and stop  For cough > mucinex dm 1200 mg every 12 hours Only use your albuterol as a rescue medication   06/04/2014 f/u ov/Brayson Livesey re: asthma vs pseudoasthma on dulera 200 2bid  Chief Complaint  Patient presents with  . Follow-up    Pt states that her breathing was better, then worse, then better again for the past wk. She is using her proair 1-2 x per day. She still c/o nasal congestion and has non prod cough.  finishing up 20 days of augmentin   Last saba 4 h prior to OV  And starting to "feel tight" again, says can hear her wheezing across the room when it's bad  rec Add singulair 10 mg each evening to your current meds Please see patient coordinator before you leave today  to schedule sinus ct for next week, not sooner Prednisone 10 mg take  4 each am x 2 days,   2 each am x 2 days,  1 each am x 2 days and stop    07/03/2014 f/u ov/Jayona Mccaig [id number] vs uacs   Chief Complaint  Patient presents with  . Follow-up    Pt states that her breathing is some better since the last visit. She has started coughing up minimal yellow/brown x 2 days. She is using albuterol inhaler 1-2 x per wk on average.   lately every time walk across office get sob/ not sure albuterol helps this problem Also hs cough persists  rec dymista one twice daily until you use up the sample and fill the rx if helps you breath through you nose and  if not we need to refer you to an ENT doctor For drainage take chlortrimeton (chlorpheniramine) 4 mg every 4 hours available over the counter (may cause drowsiness)  Please see patient coordinator before you leave today  to schedule methacholine challenge test off dulera x 24 hours first    07/08/2014 acute ov/Kazim Corrales re: sob/ cough/ wheeze Chief Complaint  Patient presents with  . Acute Visit    Pt c/o increased wheezing x 2 days- has used rescue inhaler x 2 today with minimal relief. She is also c/o more SOB than usual.  Her cough has been prod with brown sputum for the past 2 days.    Misunderstood and stopped dulera p last ov and gradually worse since then  rec Augmentin 875 mg take one pill twice daily  X 10 days - take at breakfast and supper with large glass of water.  It would help reduce the usual side effects (diarrhea and yeast infections) if you ate cultured yogurt at lunch.  Prednisone 10 mg take  4 each am x 2 days,   2 each am x 2 days,  1 each am x 2 days and stop  Resume dulera 200 Take 2 puffs first thing in am and then another 2 puffs about 12 hours later.    08/14/2014 f/u ov/Sarahbeth Cashin re: cough variant asthma Chief Complaint  Patient presents with  . Follow-up    Pt states breathing is back to her normal baseline and cough has resolved. No new co's today.  on dulera 200 2bid / singulair no need for saba or cough meds/ still on max gerd rx     No obvious day to day or daytime variabilty or assoc  cp or chest tightness, subjective wheeze or overt   hb symptoms. No unusual exp hx or h/o childhood pna/ asthma or knowledge of premature birth.  Sleeping ok without nocturnal  or early am exacerbation  of respiratory  c/o's or need for noct saba. Also denies any obvious fluctuation of symptoms with weather or environmental changes or other aggravating or alleviating factors except as outlined above   Current Medications, Allergies, Complete Past Medical History, Past Surgical History,  Family History, and Social History were reviewed in [employer] record.  ROS  The following are not active complaints unless bolded sore throat, dysphagia, dental problems, itching, sneezing,  nasal congestion  better with dymista or excess/ purulent secretions, ear ache,   fever, chills, sweats, unintended wt loss, pleuritic or exertional cp, hemoptysis,  orthopnea pnd or leg swelling, presyncope, palpitations, heartburn, abdominal pain, anorexia, nausea, vomiting, diarrhea  or change in bowel or urinary habits, change in stools or urine, dysuria,hematuria,  rash, arthralgias, visual complaints, headache, numbness weakness or ataxia or problems with walking or coordination,  change in mood/affect or memory.                      Objective:   Physical Exam  amb wf nad  10/13/2013        209  >  04/22/2014  251 > 06/04/2014  250 >  07/03/2014   253  > 07/08/2014  257 > 08/14/14 260 Wt Readings from Last 3 Encounters:  09/09/13 201 lb (91.173 kg)  05/16/13 187 lb (84.823 kg)  09/15/11 202 lb (91.627 kg)      HEENT: nl dentition, mucopurulent secretions / bilateral turbinates, and orophanx. Nl external ear canals without cough reflex   NECK :  without JVD/Nodes/TM/ nl carotid upstrokes bilaterally   LUNGS: no acc muscle use,   Clear bilaterally    CV:  RRR  no s3 or murmur or increase in P2, no edema   ABD:  soft and nontender with nl excursion in the supine position. No bruits or organomegaly, bowel sounds nl  MS:  warm without deformities, calf tenderness, cyanosis or clubbing  SKIN: warm and dry without lesions         07/23/13  No active dz per radiology report     Assessment & Plan:

## 2014-08-15 ENCOUNTER — Encounter: Payer: Self-pay | Admitting: Internal Medicine

## 2014-08-15 NOTE — Assessment & Plan Note (Addendum)
-   trial of dulera 100 Take 2 bid 09/09/13 > ? Still needing saba with activity but nl pft's p taking saba am 10/13/2013 >  dulera 200 2 bid trial  - flared on dulera 200 with mostly upper airway symptoms > rechallenge with dulera 100 2bid > preferred the 200 dose - allergy profile 06/04/2014 > IgE 10, neg RAST - singulair trial 06/04/2014 > D/c 08/2014  - 06/04/2014  p extensive coaching HFA effectiveness =    75%  - spirometry off dulera 07/08/14 =  FEV1 down from 4.04 to 2.22(65%) with ratio 67 and clasisic obst f/v curve   I had an extended discussion with the patient reviewing all relevant studies completed to date and  lasting 15 to 20 minutes of a 25 minute visit on the following ongoing concerns:  1) the breakthrough cough off dulera on gerd and rhinitis rx plus singulair strongly suggests cough variant asthma and that singulair is not effective so should continue dulera 200 2bid for now though may be able to try stepdown again in 3 m if she continue to do well and achieves better hfa technique consistently   2) ok to stop singulair therefore when rx runs out which should correlate well with the end of the allergy season anyway  3) gerd rx was being directed by primary care prior to cough flare for overt HB so should be continued along with diet/ more efforts toward wt loss would help   4) Each maintenance medication was reviewed in detail including most importantly the difference between maintenance and as needed and under what circumstances the prns are to be used.  Please see instructions for details which were reviewed in writing and the patient given a copy.

## 2014-08-19 ENCOUNTER — Encounter: Payer: Self-pay | Admitting: Podiatry

## 2014-08-19 ENCOUNTER — Ambulatory Visit (INDEPENDENT_AMBULATORY_CARE_PROVIDER_SITE_OTHER): Payer: BLUE CROSS/BLUE SHIELD

## 2014-08-19 ENCOUNTER — Ambulatory Visit (INDEPENDENT_AMBULATORY_CARE_PROVIDER_SITE_OTHER): Payer: BLUE CROSS/BLUE SHIELD | Admitting: Podiatry

## 2014-08-19 VITALS — BP 126/86 | HR 95 | Resp 15

## 2014-08-19 DIAGNOSIS — Q828 Other specified congenital malformations of skin: Secondary | ICD-10-CM

## 2014-08-19 DIAGNOSIS — M79671 Pain in right foot: Secondary | ICD-10-CM

## 2014-08-19 DIAGNOSIS — M79672 Pain in left foot: Secondary | ICD-10-CM

## 2014-08-19 DIAGNOSIS — M722 Plantar fascial fibromatosis: Secondary | ICD-10-CM

## 2014-08-19 MED ORDER — TRIAMCINOLONE ACETONIDE 10 MG/ML IJ SUSP
10.0000 mg | Freq: Once | INTRAMUSCULAR | Status: AC
  Administered 2014-08-19: 10 mg

## 2014-08-19 MED ORDER — DICLOFENAC SODIUM 75 MG PO TBEC: 75.0000 mg | DELAYED_RELEASE_TABLET | Freq: Two times a day (BID) | ORAL | Status: DC

## 2014-08-19 MED ORDER — DICLOFENAC SODIUM 75 MG PO TBEC
75.0000 mg | DELAYED_RELEASE_TABLET | Freq: Two times a day (BID) | ORAL | Status: DC
Start: 2014-08-19 — End: 2014-08-19

## 2014-08-19 NOTE — Progress Notes (Signed)
   Subjective:    Patient ID: Sabrina Welch, female    DOB: Nov 23, 1970, 44 y.o.   MRN: [id number]  HPI Pt presents with bilateral PF heel pain, worse in left foot, tried NSAIDS ice and OTC othotics   Review of Systems  All other systems reviewed and are negative.      Objective:   Physical Exam        Assessment & Plan:

## 2014-08-19 NOTE — Patient Instructions (Signed)

## 2014-08-19 NOTE — Progress Notes (Signed)
Subjective:     Patient ID: Sabrina Welch, female   DOB: 21-Nov-1970, 44 y.o.   MRN: [id number]  HPI patient presents with heel pain bilateral with left being worse than right and states that she's tried nonsteroidals and over-the-counter orthotics without relief. States it's been present for several months   Review of Systems  All other systems reviewed and are negative.      Objective:   Physical Exam  Constitutional: She is oriented to person, place, and time.  Cardiovascular: Intact distal pulses.   Musculoskeletal: Normal range of motion.  Neurological: She is oriented to person, place, and time.  Skin: Skin is warm.  Nursing note and vitals reviewed.  neurovascular status intact with muscle strength adequate and range of motion subtalar midtarsal joint noted to be within normal limits. Patient's noted to have exquisite discomfort plantar aspect heel left over right with inflammation and fluid around the medial band and moderate depression of the arch bilateral. Left heel is bothering her the most     Assessment:     Plantar fasciitis left at the insertion of the tendon the calcaneus    Plan:     H&P and x-rays reviewed and today I injected the left plantar fascia 3 Milligan Kenalog 5 mg Xylocaine and applied fascial brace with instructions on usage. Reappoint to recheck in one week

## 2014-08-26 ENCOUNTER — Encounter: Payer: Self-pay | Admitting: Podiatry

## 2014-08-26 ENCOUNTER — Ambulatory Visit (INDEPENDENT_AMBULATORY_CARE_PROVIDER_SITE_OTHER): Payer: BLUE CROSS/BLUE SHIELD | Admitting: Podiatry

## 2014-08-26 VITALS — BP 130/89 | HR 88 | Resp 15

## 2014-08-26 DIAGNOSIS — M722 Plantar fascial fibromatosis: Secondary | ICD-10-CM | POA: Diagnosis not present

## 2014-08-26 MED ORDER — TRIAMCINOLONE ACETONIDE 10 MG/ML IJ SUSP
10.0000 mg | Freq: Once | INTRAMUSCULAR | Status: AC
  Administered 2014-08-26: 10 mg

## 2014-08-27 NOTE — Progress Notes (Signed)
Subjective:     Patient ID: Sabrina Welch, female   DOB: 1971-02-09, 44 y.o.   MRN: [id number]  HPI patient presents stating that my heel is really hurting left. States it's worse when on it during the day and also hurts after sitting and when getting up in the morning   Review of Systems     Objective:   Physical Exam  Neurovascular status intact with muscle strength adequate and range of motion within normal limits. Patient's noted to have severe discomfort plantar left heel at the insertion to the calcaneus with inflammation and fluid buildup upon palpation with minimal response to previous treatment    Assessment:     Acute plantar fasciitis left with failure to respond so far to conservative care    Plan:     Injected the left plantar fascia 3 mg Kenalog 5 mg Xylocaine and at this time due to intensity of discomfort dispensed air fracture walker with instructions on usage. Patient will be seen back to recheck in 3 weeks and will probably require orthotics and we will see response and may end up requiring either shockwave therapy or endoscopic surgery

## 2014-09-03 ENCOUNTER — Other Ambulatory Visit (INDEPENDENT_AMBULATORY_CARE_PROVIDER_SITE_OTHER): Payer: BLUE CROSS/BLUE SHIELD

## 2014-09-03 ENCOUNTER — Encounter: Payer: Self-pay | Admitting: Internal Medicine

## 2014-09-03 ENCOUNTER — Ambulatory Visit (INDEPENDENT_AMBULATORY_CARE_PROVIDER_SITE_OTHER): Payer: BLUE CROSS/BLUE SHIELD | Admitting: Internal Medicine

## 2014-09-03 VITALS — BP 118/86 | HR 96 | Temp 98.7°F | Resp 14 | Ht 70.0 in | Wt 258.0 lb

## 2014-09-03 DIAGNOSIS — N393 Stress incontinence (female) (male): Secondary | ICD-10-CM | POA: Diagnosis not present

## 2014-09-03 DIAGNOSIS — F329 Major depressive disorder, single episode, unspecified: Secondary | ICD-10-CM

## 2014-09-03 DIAGNOSIS — J45991 Cough variant asthma: Secondary | ICD-10-CM | POA: Diagnosis not present

## 2014-09-03 DIAGNOSIS — Z0000 Encounter for general adult medical examination without abnormal findings: Secondary | ICD-10-CM

## 2014-09-03 DIAGNOSIS — K760 Fatty (change of) liver, not elsewhere classified: Secondary | ICD-10-CM | POA: Diagnosis not present

## 2014-09-03 DIAGNOSIS — F32A Depression, unspecified: Secondary | ICD-10-CM

## 2014-09-03 LAB — COMPREHENSIVE METABOLIC PANEL
ALBUMIN: 4.2 g/dL (ref 3.5–5.2)
ALT: 10 U/L (ref 0–35)
AST: 14 U/L (ref 0–37)
Alkaline Phosphatase: 63 U/L (ref 39–117)
BILIRUBIN TOTAL: 0.4 mg/dL (ref 0.2–1.2)
BUN: 14 mg/dL (ref 6–23)
CALCIUM: 9.6 mg/dL (ref 8.4–10.5)
CO2: 32 meq/L (ref 19–32)
CREATININE: 1.1 mg/dL (ref 0.40–1.20)
Chloride: 102 mEq/L (ref 96–112)
GFR: 57.75 mL/min — AB (ref >60.00)
GLUCOSE: 96 mg/dL (ref 70–99)
Potassium: 4.1 mEq/L (ref 3.5–5.1)
Sodium: 139 mEq/L (ref 135–145)
Total Protein: 7.7 g/dL (ref 6.0–8.3)

## 2014-09-03 LAB — LIPID PANEL
Cholesterol: 277 mg/dL — ABNORMAL HIGH (ref 0–200)
HDL: 58.6 mg/dL (ref >39.00)
LDL Cholesterol: 197 mg/dL — ABNORMAL HIGH (ref 0–99)
NonHDL: 218.4
Total CHOL/HDL Ratio: 5
Triglycerides: 109 mg/dL (ref 0.0–149.0)
VLDL: 21.8 mg/dL (ref 0.0–40.0)

## 2014-09-03 LAB — TSH: TSH: 1.77 u[IU]/mL (ref 0.35–4.50)

## 2014-09-03 LAB — HEMOGLOBIN A1C: Hgb A1c MFr Bld: 5.5 % (ref 4.6–6.5)

## 2014-09-03 MED ORDER — MIRABEGRON ER 50 MG PO TB24: 50.0000 mg | ORAL_TABLET | Freq: Every day | ORAL | Status: DC

## 2014-09-03 NOTE — Progress Notes (Signed)
Pre visit review using our clinic review tool, if applicable. No additional management support is needed unless otherwise documented below in the visit note. 

## 2014-09-03 NOTE — Patient Instructions (Signed)
We are going to check on your labs today to make sure that everything is normal and we are not missing any problems with the thyroid, sugars.   We have sent in a medicine called mybetriq which is for the bladder. Take 1 pill a day and it may take 6-8 weeks for you to notice the full benefits. It is a different kind of medicine and should not give you the problems with dry mouth or constipation like the detrol did.   Health Maintenance Adopting a healthy lifestyle and getting preventive care can go a long way to promote health and wellness. Talk with your health care provider about what schedule of regular examinations is right for you. This is a good chance for you to check in with your provider about disease prevention and staying healthy. In between checkups, there are plenty of things you can do on your own. Experts have done a lot of research about which lifestyle changes and preventive measures are most likely to keep you healthy. Ask your health care provider for more information. WEIGHT AND DIET  Eat a healthy diet  Be sure to include plenty of vegetables, fruits, low-fat dairy products, and lean protein.  Do not eat a lot of foods high in solid fats, added sugars, or salt.  Get regular exercise. This is one of the most important things you can do for your health.  Most adults should exercise for at least 150 minutes each week. The exercise should increase your heart rate and make you sweat (moderate-intensity exercise).  Most adults should also do strengthening exercises at least twice a week. This is in addition to the moderate-intensity exercise.  Maintain a healthy weight  Body mass index (BMI) is a measurement that can be used to identify possible weight problems. It estimates body fat based on height and weight. Your health care provider can help determine your BMI and help you achieve or maintain a healthy weight.  For females 68 years of age and older:   A BMI below 18.5 is  considered underweight.  A BMI of 18.5 to 24.9 is normal.  A BMI of 25 to 29.9 is considered overweight.  A BMI of 30 and above is considered obese.  Watch levels of cholesterol and blood lipids  You should start having your blood tested for lipids and cholesterol at 43 years of age, then have this test every 5 years.  You may need to have your cholesterol levels checked more often if:  Your lipid or cholesterol levels are high.  You are older than 43 years of age.  You are at high risk for heart disease.  CANCER SCREENING   Lung Cancer  Lung cancer screening is recommended for adults 1-52 years old who are at high risk for lung cancer because of a history of smoking.  A yearly low-dose CT scan of the lungs is recommended for people who:  Currently smoke.  Have quit within the past 15 years.  Have at least a 30-pack-year history of smoking. A pack year is smoking an average of one pack of cigarettes a day for 1 year.  Yearly screening should continue until it has been 15 years since you quit.  Yearly screening should stop if you develop a health problem that would prevent you from having lung cancer treatment.  Breast Cancer  Practice breast self-awareness. This means understanding how your breasts normally appear and feel.  It also means doing regular breast self-exams. Let your health care  provider know about any changes, no matter how small.  If you are in your 20s or 30s, you should have a clinical breast exam (CBE) by a health care provider every 1-3 years as part of a regular health exam.  If you are 68 or older, have a CBE every year. Also consider having a breast X-ray (mammogram) every year.  If you have a family history of breast cancer, talk to your health care provider about genetic screening.  If you are at high risk for breast cancer, talk to your health care provider about having an MRI and a mammogram every year.  Breast cancer gene (BRCA)  assessment is recommended for women who have family members with BRCA-related cancers. BRCA-related cancers include:  Breast.  Ovarian.  Tubal.  Peritoneal cancers.  Results of the assessment will determine the need for genetic counseling and BRCA1 and BRCA2 testing. Cervical Cancer Routine pelvic examinations to screen for cervical cancer are no longer recommended for nonpregnant women who are considered low risk for cancer of the pelvic organs (ovaries, uterus, and vagina) and who do not have symptoms. A pelvic examination may be necessary if you have symptoms including those associated with pelvic infections. Ask your health care provider if a screening pelvic exam is right for you.   The Pap test is the screening test for cervical cancer for women who are considered at risk.  If you had a hysterectomy for a problem that was not cancer or a condition that could lead to cancer, then you no longer need Pap tests.  If you are older than 65 years, and you have had normal Pap tests for the past 10 years, you no longer need to have Pap tests.  If you have had past treatment for cervical cancer or a condition that could lead to cancer, you need Pap tests and screening for cancer for at least 20 years after your treatment.  If you no longer get a Pap test, assess your risk factors if they change (such as having a new sexual partner). This can affect whether you should start being screened again.  Some women have medical problems that increase their chance of getting cervical cancer. If this is the case for you, your health care provider may recommend more frequent screening and Pap tests.  The human papillomavirus (HPV) test is another test that may be used for cervical cancer screening. The HPV test looks for the virus that can cause cell changes in the cervix. The cells collected during the Pap test can be tested for HPV.  The HPV test can be used to screen women 8 years of age and older.  Getting tested for HPV can extend the interval between normal Pap tests from three to five years.  An HPV test also should be used to screen women of any age who have unclear Pap test results.  After 43 years of age, women should have HPV testing as often as Pap tests.  Colorectal Cancer  This type of cancer can be detected and often prevented.  Routine colorectal cancer screening usually begins at 43 years of age and continues through 43 years of age.  Your health care provider may recommend screening at an earlier age if you have risk factors for colon cancer.  Your health care provider may also recommend using home test kits to check for hidden blood in the stool.  A small camera at the end of a tube can be used to examine your colon  directly (sigmoidoscopy or colonoscopy). This is done to check for the earliest forms of colorectal cancer.  Routine screening usually begins at age 79.  Direct examination of the colon should be repeated every 5-10 years through 43 years of age. However, you may need to be screened more often if early forms of precancerous polyps or small growths are found. Skin Cancer  Check your skin from head to toe regularly.  Tell your health care provider about any new moles or changes in moles, especially if there is a change in a mole's shape or color.  Also tell your health care provider if you have a mole that is larger than the size of a pencil eraser.  Always use sunscreen. Apply sunscreen liberally and repeatedly throughout the day.  Protect yourself by wearing long sleeves, pants, a wide-brimmed hat, and sunglasses whenever you are outside. HEART DISEASE, DIABETES, AND HIGH BLOOD PRESSURE   Have your blood pressure checked at least every 1-2 years. High blood pressure causes heart disease and increases the risk of stroke.  If you are between 20 years and 13 years old, ask your health care provider if you should take aspirin to prevent  strokes.  Have regular diabetes screenings. This involves taking a blood sample to check your fasting blood sugar level.  If you are at a normal weight and have a low risk for diabetes, have this test once every three years after 43 years of age.  If you are overweight and have a high risk for diabetes, consider being tested at a younger age or more often. PREVENTING INFECTION  Hepatitis B  If you have a higher risk for hepatitis B, you should be screened for this virus. You are considered at high risk for hepatitis B if:  You were born in a country where hepatitis B is common. Ask your health care provider which countries are considered high risk.  Your parents were born in a high-risk country, and you have not been immunized against hepatitis B (hepatitis B vaccine).  You have HIV or AIDS.  You use needles to inject street drugs.  You live with someone who has hepatitis B.  You have had sex with someone who has hepatitis B.  You get hemodialysis treatment.  You take certain medicines for conditions, including cancer, organ transplantation, and autoimmune conditions. Hepatitis C  Blood testing is recommended for:  Everyone born from [birth year] through 1964.  Anyone with known risk factors for hepatitis C. Sexually transmitted infections (STIs)  You should be screened for sexually transmitted infections (STIs) including gonorrhea and chlamydia if:  You are sexually active and are younger than 43 years of age.  You are older than 43 years of age and your health care provider tells you that you are at risk for this type of infection.  Your sexual activity has changed since you were last screened and you are at an increased risk for chlamydia or gonorrhea. Ask your health care provider if you are at risk.  If you do not have HIV, but are at risk, it may be recommended that you take a prescription medicine daily to prevent HIV infection. This is called pre-exposure prophylaxis  (PrEP). You are considered at risk if:  You are sexually active and do not regularly use condoms or know the HIV status of your partner(s).  You take drugs by injection.  You are sexually active with a partner who has HIV. Talk with your health care provider about whether you are  at high risk of being infected with HIV. If you choose to begin PrEP, you should first be tested for HIV. You should then be tested every 3 months for as long as you are taking PrEP.  PREGNANCY   If you are premenopausal and you may become pregnant, ask your health care provider about preconception counseling.  If you may become pregnant, take 400 to 800 micrograms (mcg) of folic acid every day.  If you want to prevent pregnancy, talk to your health care provider about birth control (contraception). OSTEOPOROSIS AND MENOPAUSE   Osteoporosis is a disease in which the bones lose minerals and strength with aging. This can result in serious bone fractures. Your risk for osteoporosis can be identified using a bone density scan.  If you are 59 years of age or older, or if you are at risk for osteoporosis and fractures, ask your health care provider if you should be screened.  Ask your health care provider whether you should take a calcium or vitamin D supplement to lower your risk for osteoporosis.  Menopause may have certain physical symptoms and risks.  Hormone replacement therapy may reduce some of these symptoms and risks. Talk to your health care provider about whether hormone replacement therapy is right for you.  HOME CARE INSTRUCTIONS   Schedule regular health, dental, and eye exams.  Stay current with your immunizations.   Do not use any tobacco products including cigarettes, chewing tobacco, or electronic cigarettes.  If you are pregnant, do not drink alcohol.  If you are breastfeeding, limit how much and how often you drink alcohol.  Limit alcohol intake to no more than 1 drink per day for  nonpregnant women. One drink equals 12 ounces of beer, 5 ounces of wine, or 1 ounces of hard liquor.  Do not use street drugs.  Do not share needles.  Ask your health care provider for help if you need support or information about quitting drugs.  Tell your health care provider if you often feel depressed.  Tell your health care provider if you have ever been abused or do not feel safe at home. Document Released: 10/31/2010 Document Revised: 09/01/2013 Document Reviewed: 03/19/2013 [facility] Patient Information 2015 [place], [place]. This information is not intended to replace advice given to you by your health care provider. Make sure you discuss any questions you have with your health care provider.

## 2014-09-04 ENCOUNTER — Encounter: Payer: Self-pay | Admitting: Internal Medicine

## 2014-09-04 MED ORDER — AZELASTINE-FLUTICASONE 137-50 MCG/ACT NA SUSP: 1.0000 | Freq: Two times a day (BID) | NASAL | Status: DC

## 2014-09-06 ENCOUNTER — Encounter: Payer: Self-pay | Admitting: Internal Medicine

## 2014-09-06 DIAGNOSIS — N393 Stress incontinence (female) (male): Secondary | ICD-10-CM | POA: Insufficient documentation

## 2014-09-06 NOTE — Assessment & Plan Note (Signed)
Follows in pulmonary but doing well with the dulera for now.

## 2014-09-06 NOTE — Assessment & Plan Note (Signed)
Following with GI and stable.

## 2014-09-06 NOTE — Assessment & Plan Note (Signed)
Try mybetriq for her incontinence as she was not able to tolerate oxybutynin. Advised her that the results can take 2-3 months to show full benefit. Follow up after that to see if she is doing well.

## 2014-09-06 NOTE — Progress Notes (Signed)
   Subjective:    Patient ID: Sabrina Welch, female    DOB: 05/26/1970, 44 y.o.   MRN: [id number]  HPI The patient is a 44 YO female who is coming in for urinary incontinence. She leaks small amounts when she coughs or laughs or even stands quickly. This has limited her ability to do exercise and she always wears a pad. She does not have pain with urination or prolapse of bladder. She has tried oxybutynin in the past but got dry mouth and constipation and did not want to continue. Please see A/P for status and plan of chronic medical problems.   [facility], [facility], social history reviewed and updated with patient.   Review of Systems  Constitutional: Negative.   HENT: Negative.   Eyes: Negative.   Respiratory: Negative.   Cardiovascular: Negative.   Gastrointestinal: Positive for diarrhea, constipation and abdominal distention.  Genitourinary: Negative for frequency, decreased urine volume and difficulty urinating.       Incontinence, see hpi  Musculoskeletal: Negative.   Skin: Negative.   Neurological: Negative.   Psychiatric/Behavioral: Negative.       Objective:   Physical Exam  Constitutional: She is oriented to person, place, and time. She appears well-developed and well-nourished.  HENT:  Head: Normocephalic and atraumatic.  Eyes: EOM are normal.  Neck: Normal range of motion.  Cardiovascular: Normal rate and regular rhythm.   Pulmonary/Chest: Effort normal and breath sounds normal. No respiratory distress. She has no wheezes.  Abdominal: Soft. She exhibits no distension. There is no tenderness.  Musculoskeletal: She exhibits no edema.  Neurological: She is alert and oriented to person, place, and time. Coordination normal.  Skin: Skin is warm and dry.  Psychiatric: She has a normal mood and affect.   Filed Vitals:   09/03/14 0900  BP: 118/86  Pulse: 96  Temp: 98.7 F (37.1 C)  TempSrc: Oral  Resp: 14  Height: 5\' 10"  (1.778 m)  Weight: 258 lb (117.028 kg)  SpO2: 98%      Assessment & Plan:

## 2014-09-06 NOTE — Assessment & Plan Note (Signed)
Sees psych and doing well on current regimen.

## 2014-09-07 ENCOUNTER — Other Ambulatory Visit: Payer: Self-pay | Admitting: Internal Medicine

## 2014-09-07 MED ORDER — ATORVASTATIN CALCIUM 10 MG PO TABS: 10.0000 mg | ORAL_TABLET | Freq: Every day | ORAL | Status: DC

## 2014-09-10 ENCOUNTER — Telehealth: Payer: Self-pay | Admitting: Internal Medicine

## 2014-09-10 ENCOUNTER — Other Ambulatory Visit: Payer: Self-pay | Admitting: Obstetrics and Gynecology

## 2014-09-10 NOTE — Telephone Encounter (Signed)
Fine with me but recommend nursing recheck BP 2-3 weeks after starting to make sure it is not affecting her BP.

## 2014-09-10 NOTE — Telephone Encounter (Signed)
Pt called in and said that she seen her obgyn today and Dr want to put her on Yaz for birth control.  It this ok with her high BP??      Best number [phone]

## 2014-09-10 NOTE — Telephone Encounter (Signed)
Spoke with Sabrina Welch at [facility]. Pt's insurance will not cover Dymista. They want to know if they can split up the 2 medications. I have given the verbal order to split medications. Nothing further was needed.

## 2014-09-10 NOTE — Telephone Encounter (Signed)
Patient wanted to know if she should take birth control along with cholesterol medication. Per Dr. Dorise HissKollar its fine to take both. Patient aware.

## 2014-09-11 LAB — CYTOLOGY - PAP

## 2014-09-17 ENCOUNTER — Ambulatory Visit (INDEPENDENT_AMBULATORY_CARE_PROVIDER_SITE_OTHER): Payer: BLUE CROSS/BLUE SHIELD | Admitting: Podiatry

## 2014-09-17 ENCOUNTER — Encounter: Payer: Self-pay | Admitting: Podiatry

## 2014-09-17 VITALS — BP 108/80 | HR 100 | Resp 12

## 2014-09-17 DIAGNOSIS — M722 Plantar fascial fibromatosis: Secondary | ICD-10-CM | POA: Diagnosis not present

## 2014-09-17 MED ORDER — TRIAMCINOLONE ACETONIDE 10 MG/ML IJ SUSP: 10.0000 mg | Freq: Once | INTRAMUSCULAR | Status: DC

## 2014-09-17 MED ORDER — TRIAMCINOLONE ACETONIDE 10 MG/ML IJ SUSP
10.0000 mg | Freq: Once | INTRAMUSCULAR | Status: AC
Start: 2014-09-17 — End: 2014-09-17
  Administered 2014-09-17: 10 mg

## 2014-09-18 NOTE — Progress Notes (Signed)
Subjective:     Patient ID: Sabrina Welch, female   DOB: 08/22/1970, 44 y.o.   MRN: [id number]  HPI patient presents I'm improved but I'm still having pain and I think I can get some kind of inserts it would be helpful   Review of Systems     Objective:   Physical Exam Neurovascular status intact muscle strength adequate with continued discomfort in the left foot with inflammation noted along the lateral side and he'll    Assessment:     Continuation of tendinitis inflammatory changes    Plan:     Careful injection into the area of intense discomfort and scanned for custom orthotics to reduce the stress on the feet

## 2014-09-23 ENCOUNTER — Telehealth: Payer: Self-pay | Admitting: *Deleted

## 2014-09-23 NOTE — Telephone Encounter (Signed)
"  Dr. Charlsie Merlesegal asked me to call you once I had my other surgery scheduled and told me to schedule my foot surgery a couple of weeks later.  My surgery is scheduled for July 27.  So, can we schedule my foot surgery for 08/16?"  Yes, that day should be fine.  "What time will it be?"  Someone from the surgical center normally call a day or two prior to and will give you the exact arrival time.  All I can tell you is that it will be that day.  Have you signed consent forms?  "No, I haven't signed anything.  He wanted me to wait and schedule after I got a date for my other surgery."  Would you like me to transfer you to a scheduler?  "Yes please, that would be great."  I transferred her to a scheduler.

## 2014-09-25 ENCOUNTER — Telehealth: Payer: Self-pay | Admitting: *Deleted

## 2014-09-25 NOTE — Telephone Encounter (Signed)
Pt states Dr. Charlsie Merlesegal is treating her for left plantar fasciitis and she is in a boot, currently the right heel is hurting, what to do?  I instructed pt to take the Voltaren, but she said it hurt her stomach, but she has been taking Ibuprofen.  I encouraged pt to take the Ibuprofen if she tolerated it, as the package instructed and ice 3 -4 times daily for 10 - 15 minutes and rest. Pt was transferred to schedulers.

## 2014-10-01 ENCOUNTER — Ambulatory Visit (INDEPENDENT_AMBULATORY_CARE_PROVIDER_SITE_OTHER): Payer: BLUE CROSS/BLUE SHIELD | Admitting: Podiatry

## 2014-10-01 ENCOUNTER — Encounter: Payer: Self-pay | Admitting: Podiatry

## 2014-10-01 VITALS — BP 123/86 | HR 69 | Resp 12

## 2014-10-01 DIAGNOSIS — M722 Plantar fascial fibromatosis: Secondary | ICD-10-CM | POA: Diagnosis not present

## 2014-10-01 MED ORDER — TRAMADOL HCL 50 MG PO TABS: 50.0000 mg | ORAL_TABLET | Freq: Three times a day (TID) | ORAL | Status: DC

## 2014-10-01 MED ORDER — TRIAMCINOLONE ACETONIDE 10 MG/ML IJ SUSP
10.0000 mg | Freq: Once | INTRAMUSCULAR | Status: AC
  Administered 2014-10-01: 10 mg

## 2014-10-02 NOTE — Progress Notes (Signed)
Subjective:     Patient ID: Sabrina Welch, female   DOB: 24-Oct-1970, 44 y.o.   MRN: [id number]  HPI patient continues to experience discomfort in the plantar aspect of both feet at the insertional point tendon into the calcaneus   Review of Systems     Objective:   Physical Exam Neurovascular status intact muscle strength adequate with range of motion subtalar midtarsal joint within normal limits. Patient's noted to have inflammation of the insertional branch of the plantar fascia into the calcaneus medial side bilateral with mild fluid accumulation    Assessment:     Planter fasciitis bilateral of a moderate nature    Plan:     Injected the plantar fascia bilateral 3 mg Kenalog 5 mg Xylocaine and instructed on physical therapy. Reappoint to recheck again

## 2014-10-09 ENCOUNTER — Ambulatory Visit: Payer: BLUE CROSS/BLUE SHIELD | Admitting: *Deleted

## 2014-10-09 DIAGNOSIS — M722 Plantar fascial fibromatosis: Secondary | ICD-10-CM

## 2014-10-09 NOTE — Progress Notes (Signed)
Patient ID: Sabrina Welch, female   DOB: [birth year], 44 y.o.   MRN: [id number] PICKING UP INSERTS

## 2014-10-09 NOTE — Patient Instructions (Signed)

## 2014-10-22 ENCOUNTER — Ambulatory Visit (INDEPENDENT_AMBULATORY_CARE_PROVIDER_SITE_OTHER): Payer: BLUE CROSS/BLUE SHIELD | Admitting: Podiatry

## 2014-10-22 ENCOUNTER — Encounter: Payer: Self-pay | Admitting: Podiatry

## 2014-10-22 VITALS — BP 126/87 | HR 96 | Resp 12

## 2014-10-22 DIAGNOSIS — M722 Plantar fascial fibromatosis: Secondary | ICD-10-CM

## 2014-10-22 MED ORDER — TRIAMCINOLONE ACETONIDE 10 MG/ML IJ SUSP
10.0000 mg | Freq: Once | INTRAMUSCULAR | Status: AC
Start: 2014-10-22 — End: 2014-10-22
  Administered 2014-10-22: 10 mg

## 2014-10-23 ENCOUNTER — Telehealth: Payer: Self-pay | Admitting: Internal Medicine

## 2014-10-23 NOTE — Telephone Encounter (Signed)
Spoke with patient and told her labs are due around 11/12/14.

## 2014-10-24 NOTE — Progress Notes (Signed)
Subjective:     Patient ID: Sabrina Welch, female   DOB: 06/03/1970, 44 y.o.   MRN: [id number]  HPI patient states I'm very ready to get my left heel fixed but my right heel has started to really hurt me and it may end up requiring surgery. I want to review the consent form for the left   Review of Systems     Objective:   Physical Exam Neurovascular status intact with no other changes in health history with quite a bit of discomfort in the left plantar fascia and discomfort in the right heel upon palpation medial band insertional calcaneal pain    Assessment:     Long-term plantar fasciitis left with more acute type plantar fasciitis right with inflammation and pain around the medial band bilateral    Plan:     H&P and conditions reviewed and at this point I have recommended surgery for the left and I allowed the patient to review a consent form going over alternative treatments and complications as listed. Patient wants surgery and understands and at this time is scheduled for her outpatient surgery and is given all preoperative instructions. Patient had air fracture walker dispensed with instructions on usage and I also went ahead and injected the right plantar fascia 3 Milligan Kenalog 5 mg Xylocaine. Understands total recovery for the left heel may be upwards of 6 months to one year

## 2014-11-09 ENCOUNTER — Telehealth: Payer: Self-pay | Admitting: *Deleted

## 2014-11-09 ENCOUNTER — Other Ambulatory Visit (INDEPENDENT_AMBULATORY_CARE_PROVIDER_SITE_OTHER): Payer: BLUE CROSS/BLUE SHIELD

## 2014-11-09 DIAGNOSIS — D508 Other iron deficiency anemias: Secondary | ICD-10-CM | POA: Diagnosis not present

## 2014-11-09 LAB — CBC WITH DIFFERENTIAL/PLATELET
BASOS ABS: 0.1 10*3/uL (ref 0.0–0.1)
BASOS PCT: 1.2 % (ref 0.0–3.0)
Eosinophils Absolute: 0.2 10*3/uL (ref 0.0–0.7)
Eosinophils Relative: 2.1 % (ref 0.0–5.0)
HEMATOCRIT: 39.5 % (ref 36.0–46.0)
HEMOGLOBIN: 13.2 g/dL (ref 12.0–15.0)
Lymphocytes Relative: 21.4 % (ref 12.0–46.0)
Lymphs Abs: 2 10*3/uL (ref 0.7–4.0)
MCHC: 33.6 g/dL (ref 30.0–36.0)
MCV: 90.4 fl (ref 78.0–100.0)
Monocytes Absolute: 0.7 10*3/uL (ref 0.1–1.0)
Monocytes Relative: 7.1 % (ref 3.0–12.0)
Neutro Abs: 6.5 10*3/uL (ref 1.4–7.7)
Neutrophils Relative %: 68.2 % (ref 43.0–77.0)
Platelets: 326 10*3/uL (ref 150.0–400.0)
RBC: 4.36 Mil/uL (ref 3.87–5.11)
RDW: 12.9 % (ref 11.5–15.5)
WBC: 9.5 10*3/uL (ref 4.0–10.5)

## 2014-11-09 LAB — IBC PANEL
Iron: 64 ug/dL (ref 42–145)
Saturation Ratios: 16.4 % — ABNORMAL LOW (ref 20.0–50.0)
Transferrin: 279 mg/dL (ref 212.0–360.0)

## 2014-11-09 NOTE — Telephone Encounter (Signed)
-----   Message from Chrystie NoseLinda R Hunt, RN sent at 08/14/2014  9:22 AM EDT ----- Regarding: Labs Pt needs labs in 3 mth, order in epic.

## 2014-11-09 NOTE — Telephone Encounter (Signed)
Patient will come for labs.  

## 2014-11-09 NOTE — Telephone Encounter (Signed)
Left a message for patient to call back. 

## 2014-11-12 ENCOUNTER — Ambulatory Visit: Payer: BLUE CROSS/BLUE SHIELD | Admitting: Podiatry

## 2014-11-16 ENCOUNTER — Other Ambulatory Visit: Payer: Self-pay | Admitting: Obstetrics and Gynecology

## 2014-11-19 ENCOUNTER — Encounter (HOSPITAL_COMMUNITY): Payer: Self-pay

## 2014-11-19 ENCOUNTER — Encounter (HOSPITAL_COMMUNITY)
Admission: RE | Admit: 2014-11-19 | Discharge: 2014-11-19 | Disposition: A | Discharge status: Home / Self Care | Payer: BLUE CROSS/BLUE SHIELD | Source: Ambulatory Visit | Attending: Obstetrics and Gynecology | Admitting: Obstetrics and Gynecology

## 2014-11-19 DIAGNOSIS — N920 Excessive and frequent menstruation with regular cycle: Secondary | ICD-10-CM | POA: Insufficient documentation

## 2014-11-19 DIAGNOSIS — N816 Rectocele: Secondary | ICD-10-CM | POA: Insufficient documentation

## 2014-11-19 DIAGNOSIS — N393 Stress incontinence (female) (male): Secondary | ICD-10-CM | POA: Insufficient documentation

## 2014-11-19 DIAGNOSIS — Z01818 Encounter for other preprocedural examination: Secondary | ICD-10-CM | POA: Diagnosis present

## 2014-11-19 HISTORY — DX: Anxiety disorder, unspecified: F41.9

## 2014-11-19 LAB — TYPE AND SCREEN
ABO/RH(D): O POS
Antibody Screen: NEGATIVE

## 2014-11-19 LAB — CBC
HEMATOCRIT: 38.9 % (ref 36.0–46.0)
Hemoglobin: 12.7 g/dL (ref 12.0–15.0)
MCH: 30.2 pg (ref 26.0–34.0)
MCHC: 32.6 g/dL (ref 30.0–36.0)
MCV: 92.6 fL (ref 78.0–100.0)
Platelets: 311 10*3/uL (ref 150–400)
RBC: 4.2 MIL/uL (ref 3.87–5.11)
RDW: 13.3 % (ref 11.5–15.5)
WBC: 10.5 10*3/uL (ref 4.0–10.5)

## 2014-11-19 NOTE — Patient Instructions (Signed)
Your procedure is scheduled on: October 26, 2014  Enter through the Main Entrance of [facility] at: 6:00 am   Pick up the phone at the desk and dial [phone].  Call this number if you have problems the morning of surgery: [phone].  Remember: Do NOT eat food: after midnight on Tuesday  Do NOT drink clear liquids after:  Midnight on Tuesday  Take these medicines the morning of surgery with a SIP OF WATER:  Wellbutrin, Vibryd, and Nexium  Use Dulera inhaler morning of surgery and bring other inhaler with you.   Do NOT wear jewelry (body piercing), metal hair clips/bobby pins, make-up, or nail polish. Do NOT wear lotions, powders, or perfumes.  You may wear deoderant. Do NOT shave for 48 hours prior to surgery. Do NOT bring valuables to the hospital. Contacts, dentures, or bridgework may not be worn into surgery. Leave suitcase in car.  After surgery it may be brought to your room.  For patients admitted to the hospital, checkout time is 11:00 AM the day of discharge.
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
## 2014-11-24 NOTE — Anesthesia Preprocedure Evaluation (Addendum)
Anesthesia Evaluation  Patient identified by MRN, date of birth, ID band Patient awake    Reviewed: Allergy & Precautions, NPO status , Patient's Chart, lab work & pertinent test results  History of Anesthesia Complications Negative for: history of anesthetic complications  Airway Mallampati: II  TM Distance: >3 FB Neck ROM: Full    Dental no notable dental hx. (+) Dental Advisory Given   Pulmonary asthma , former smoker,  breath sounds clear to auscultation  Pulmonary exam normal       Cardiovascular negative cardio ROS Normal cardiovascular examRhythm:Regular Rate:Normal     Neuro/Psych PSYCHIATRIC DISORDERS Anxiety Depression negative neurological ROS     GI/Hepatic Neg liver ROS, GERD-  Medicated and Controlled,  Endo/Other  obesity  Renal/GU negative Renal ROS  negative genitourinary   Musculoskeletal negative musculoskeletal ROS (+)   Abdominal   Peds negative pediatric ROS (+)  Hematology  (+) anemia ,   Anesthesia Other Findings   Reproductive/Obstetrics negative OB ROS                            Anesthesia Physical Anesthesia Plan  ASA: III  Anesthesia Plan: General   Post-op Pain Management:    Induction: Intravenous  Airway Management Planned: Oral ETT  Additional Equipment:   Intra-op Plan:   Post-operative Plan: Extubation in OR  Informed Consent: I have reviewed the patients History and Physical, chart, labs and discussed the procedure including the risks, benefits and alternatives for the proposed anesthesia with the patient or authorized representative who has indicated his/her understanding and acceptance.   Dental advisory given  Plan Discussed with: CRNA  Anesthesia Plan Comments:         Anesthesia Quick Evaluation

## 2014-11-25 ENCOUNTER — Ambulatory Visit (HOSPITAL_COMMUNITY): Payer: BLUE CROSS/BLUE SHIELD | Admitting: Anesthesiology

## 2014-11-25 ENCOUNTER — Encounter (HOSPITAL_COMMUNITY): Payer: Self-pay | Admitting: *Deleted

## 2014-11-25 ENCOUNTER — Ambulatory Visit (HOSPITAL_COMMUNITY)
Admission: RE | Admit: 2014-11-25 | Discharge: 2014-11-26 | Disposition: A | Discharge status: Home / Self Care | Payer: BLUE CROSS/BLUE SHIELD | Source: Ambulatory Visit | Attending: Obstetrics and Gynecology | Admitting: Obstetrics and Gynecology

## 2014-11-25 ENCOUNTER — Encounter (HOSPITAL_COMMUNITY)
Admission: RE | Disposition: A | Discharge status: Home / Self Care | Payer: Self-pay | Source: Ambulatory Visit | Attending: Obstetrics and Gynecology

## 2014-11-25 DIAGNOSIS — N941 Dyspareunia: Secondary | ICD-10-CM | POA: Diagnosis not present

## 2014-11-25 DIAGNOSIS — N920 Excessive and frequent menstruation with regular cycle: Secondary | ICD-10-CM | POA: Insufficient documentation

## 2014-11-25 DIAGNOSIS — F419 Anxiety disorder, unspecified: Secondary | ICD-10-CM | POA: Diagnosis not present

## 2014-11-25 DIAGNOSIS — J45909 Unspecified asthma, uncomplicated: Secondary | ICD-10-CM | POA: Insufficient documentation

## 2014-11-25 DIAGNOSIS — Z87891 Personal history of nicotine dependence: Secondary | ICD-10-CM | POA: Diagnosis not present

## 2014-11-25 DIAGNOSIS — K589 Irritable bowel syndrome without diarrhea: Secondary | ICD-10-CM | POA: Insufficient documentation

## 2014-11-25 DIAGNOSIS — F329 Major depressive disorder, single episode, unspecified: Secondary | ICD-10-CM | POA: Diagnosis not present

## 2014-11-25 DIAGNOSIS — Z79899 Other long term (current) drug therapy: Secondary | ICD-10-CM | POA: Diagnosis not present

## 2014-11-25 DIAGNOSIS — N816 Rectocele: Secondary | ICD-10-CM | POA: Diagnosis not present

## 2014-11-25 DIAGNOSIS — N8110 Cystocele, unspecified: Secondary | ICD-10-CM | POA: Diagnosis not present

## 2014-11-25 DIAGNOSIS — N393 Stress incontinence (female) (male): Secondary | ICD-10-CM | POA: Insufficient documentation

## 2014-11-25 DIAGNOSIS — D649 Anemia, unspecified: Secondary | ICD-10-CM | POA: Diagnosis not present

## 2014-11-25 DIAGNOSIS — K760 Fatty (change of) liver, not elsewhere classified: Secondary | ICD-10-CM | POA: Insufficient documentation

## 2014-11-25 DIAGNOSIS — K219 Gastro-esophageal reflux disease without esophagitis: Secondary | ICD-10-CM | POA: Diagnosis not present

## 2014-11-25 DIAGNOSIS — Z9889 Other specified postprocedural states: Secondary | ICD-10-CM

## 2014-11-25 HISTORY — PX: BLADDER SUSPENSION: SHX72

## 2014-11-25 HISTORY — PX: ROBOTIC ASSISTED TOTAL HYSTERECTOMY: SHX6085

## 2014-11-25 HISTORY — PX: CYSTOSCOPY: SHX5120

## 2014-11-25 HISTORY — PX: OOPHORECTOMY: SHX6387

## 2014-11-25 HISTORY — PX: BILATERAL SALPINGECTOMY: SHX5743

## 2014-11-25 LAB — PREGNANCY, URINE: Preg Test, Ur: NEGATIVE

## 2014-11-25 SURGERY — ROBOTIC ASSISTED TOTAL HYSTERECTOMY
Anesthesia: General | Site: Vagina | Laterality: Right

## 2014-11-25 MED ORDER — CEFAZOLIN SODIUM-DEXTROSE 2-3 GM-% IV SOLR
INTRAVENOUS | Status: AC
  Filled 2014-11-25: qty 50

## 2014-11-25 MED ORDER — SUCCINYLCHOLINE CHLORIDE 200 MG/10ML IV SOSY
PREFILLED_SYRINGE | INTRAVENOUS | Status: DC | PRN
  Administered 2014-11-25: 100 mg via INTRAVENOUS

## 2014-11-25 MED ORDER — KETOROLAC TROMETHAMINE 30 MG/ML IJ SOLN
INTRAMUSCULAR | Status: AC
  Filled 2014-11-25: qty 1

## 2014-11-25 MED ORDER — ONDANSETRON HCL 4 MG/2ML IJ SOLN: 4.0000 mg | Freq: Once | INTRAMUSCULAR | Status: DC | PRN

## 2014-11-25 MED ORDER — ALBUTEROL SULFATE (2.5 MG/3ML) 0.083% IN NEBU
3.0000 mL | INHALATION_SOLUTION | Freq: Four times a day (QID) | RESPIRATORY_TRACT | Status: DC | PRN
  Administered 2014-11-25: 3 mL via RESPIRATORY_TRACT

## 2014-11-25 MED ORDER — FENTANYL CITRATE (PF) 100 MCG/2ML IJ SOLN
25.0000 ug | INTRAMUSCULAR | Status: DC | PRN
  Administered 2014-11-25 (×2): 25 ug via INTRAVENOUS

## 2014-11-25 MED ORDER — LIDOCAINE HCL (CARDIAC) 20 MG/ML IV SOLN
INTRAVENOUS | Status: AC
  Filled 2014-11-25: qty 5

## 2014-11-25 MED ORDER — FENTANYL CITRATE (PF) 100 MCG/2ML IJ SOLN
INTRAMUSCULAR | Status: AC
  Filled 2014-11-25: qty 2

## 2014-11-25 MED ORDER — DEXAMETHASONE SODIUM PHOSPHATE 10 MG/ML IJ SOLN
INTRAMUSCULAR | Status: DC | PRN
  Administered 2014-11-25: 10 mg via INTRAVENOUS

## 2014-11-25 MED ORDER — LIDOCAINE-EPINEPHRINE 0.5 %-1:200000 IJ SOLN
INTRAMUSCULAR | Status: DC | PRN
  Administered 2014-11-25: 8 mL

## 2014-11-25 MED ORDER — MOMETASONE FURO-FORMOTEROL FUM 200-5 MCG/ACT IN AERO
1.0000 | INHALATION_SPRAY | Freq: Two times a day (BID) | RESPIRATORY_TRACT | Status: DC
  Administered 2014-11-26: 1 via RESPIRATORY_TRACT
  Filled 2014-11-25: qty 8.8

## 2014-11-25 MED ORDER — ARTIFICIAL TEARS OP OINT
TOPICAL_OINTMENT | OPHTHALMIC | Status: DC | PRN
  Administered 2014-11-25: 1 via OPHTHALMIC

## 2014-11-25 MED ORDER — DEXAMETHASONE SODIUM PHOSPHATE 10 MG/ML IJ SOLN
INTRAMUSCULAR | Status: AC
  Filled 2014-11-25: qty 1

## 2014-11-25 MED ORDER — NEOSTIGMINE METHYLSULFATE 10 MG/10ML IV SOLN
INTRAVENOUS | Status: AC
  Filled 2014-11-25: qty 1

## 2014-11-25 MED ORDER — LACTATED RINGERS IV SOLN
INTRAVENOUS | Status: DC
  Administered 2014-11-25 (×3): via INTRAVENOUS

## 2014-11-25 MED ORDER — BUTORPHANOL TARTRATE 1 MG/ML IJ SOLN
INTRAMUSCULAR | Status: AC
Start: 2014-11-25 — End: 2014-11-26
  Filled 2014-11-25: qty 2

## 2014-11-25 MED ORDER — ONDANSETRON HCL 4 MG/2ML IJ SOLN
INTRAMUSCULAR | Status: DC | PRN
  Administered 2014-11-25: 4 mg via INTRAVENOUS

## 2014-11-25 MED ORDER — PROPOFOL 10 MG/ML IV BOLUS
INTRAVENOUS | Status: DC | PRN
  Administered 2014-11-25: 150 mg via INTRAVENOUS

## 2014-11-25 MED ORDER — DICYCLOMINE HCL 10 MG PO CAPS
10.0000 mg | ORAL_CAPSULE | Freq: Two times a day (BID) | ORAL | Status: DC | PRN
  Filled 2014-11-25: qty 1

## 2014-11-25 MED ORDER — STERILE WATER FOR IRRIGATION IR SOLN
Status: DC | PRN
  Administered 2014-11-25: 3000 mL

## 2014-11-25 MED ORDER — NEOSTIGMINE METHYLSULFATE 10 MG/10ML IV SOLN
INTRAVENOUS | Status: DC | PRN
  Administered 2014-11-25: 3 mg via INTRAVENOUS

## 2014-11-25 MED ORDER — ONDANSETRON 4 MG PO TBDP
4.0000 mg | ORAL_TABLET | Freq: Three times a day (TID) | ORAL | Status: DC | PRN
  Administered 2014-11-25 – 2014-11-26 (×2): 4 mg via ORAL
  Filled 2014-11-25 (×3): qty 1

## 2014-11-25 MED ORDER — VILAZODONE HCL 40 MG PO TABS: 40.0000 mg | ORAL_TABLET | Freq: Every day | ORAL | Status: DC

## 2014-11-25 MED ORDER — ATORVASTATIN CALCIUM 10 MG PO TABS
10.0000 mg | ORAL_TABLET | Freq: Every day | ORAL | Status: DC
  Administered 2014-11-26: 10 mg via ORAL
  Filled 2014-11-25 (×2): qty 1

## 2014-11-25 MED ORDER — ONDANSETRON HCL 4 MG/2ML IJ SOLN
INTRAMUSCULAR | Status: AC
  Filled 2014-11-25: qty 2

## 2014-11-25 MED ORDER — CEFAZOLIN SODIUM-DEXTROSE 2-3 GM-% IV SOLR
2.0000 g | INTRAVENOUS | Status: AC
  Administered 2014-11-25: 2 g via INTRAVENOUS

## 2014-11-25 MED ORDER — MIDAZOLAM HCL 5 MG/5ML IJ SOLN
INTRAMUSCULAR | Status: DC | PRN
  Administered 2014-11-25: 2 mg via INTRAVENOUS

## 2014-11-25 MED ORDER — OXYCODONE-ACETAMINOPHEN 5-325 MG PO TABS
1.0000 | ORAL_TABLET | ORAL | Status: DC | PRN
  Administered 2014-11-25 – 2014-11-26 (×4): 2 via ORAL
  Filled 2014-11-25 (×4): qty 2

## 2014-11-25 MED ORDER — BUPIVACAINE LIPOSOME 1.3 % IJ SUSP
INTRAMUSCULAR | Status: DC | PRN
  Administered 2014-11-25: 20 mL

## 2014-11-25 MED ORDER — BUPIVACAINE LIPOSOME 1.3 % IJ SUSP
20.0000 mL | Freq: Once | INTRAMUSCULAR | Status: DC
  Filled 2014-11-25: qty 20

## 2014-11-25 MED ORDER — KETOROLAC TROMETHAMINE 30 MG/ML IJ SOLN
INTRAMUSCULAR | Status: DC | PRN
  Administered 2014-11-25: 30 mg via INTRAVENOUS

## 2014-11-25 MED ORDER — SUCCINYLCHOLINE CHLORIDE 20 MG/ML IJ SOLN
INTRAMUSCULAR | Status: AC
  Filled 2014-11-25: qty 1

## 2014-11-25 MED ORDER — KETOROLAC TROMETHAMINE 30 MG/ML IJ SOLN: 30.0000 mg | Freq: Four times a day (QID) | INTRAMUSCULAR | Status: DC

## 2014-11-25 MED ORDER — MIRABEGRON ER 50 MG PO TB24: 50.0000 mg | ORAL_TABLET | Freq: Every day | ORAL | Status: DC

## 2014-11-25 MED ORDER — MENTHOL 3 MG MT LOZG
1.0000 | LOZENGE | OROMUCOSAL | Status: DC | PRN
  Administered 2014-11-25: 3 mg via ORAL
  Filled 2014-11-25: qty 9

## 2014-11-25 MED ORDER — SUFENTANIL CITRATE 50 MCG/ML IV SOLN
INTRAVENOUS | Status: AC
  Filled 2014-11-25: qty 1

## 2014-11-25 MED ORDER — PROPOFOL 10 MG/ML IV BOLUS
INTRAVENOUS | Status: AC
Start: 2014-11-25 — End: 2014-11-25
  Filled 2014-11-25: qty 20

## 2014-11-25 MED ORDER — IBUPROFEN 800 MG PO TABS
800.0000 mg | ORAL_TABLET | Freq: Three times a day (TID) | ORAL | Status: DC | PRN
  Administered 2014-11-26: 800 mg via ORAL
  Filled 2014-11-25 (×2): qty 1

## 2014-11-25 MED ORDER — BUTORPHANOL TARTRATE 1 MG/ML IJ SOLN
2.0000 mg | INTRAMUSCULAR | Status: DC | PRN
  Administered 2014-11-25: 2 mg via INTRAVENOUS
  Administered 2014-11-25 (×2): 1 mg via INTRAVENOUS
  Administered 2014-11-25 – 2014-11-26 (×3): 2 mg via INTRAVENOUS
  Filled 2014-11-25 (×5): qty 2

## 2014-11-25 MED ORDER — METHYLENE BLUE 1 % INJ SOLN
INTRAMUSCULAR | Status: AC
  Filled 2014-11-25: qty 1

## 2014-11-25 MED ORDER — ESTRADIOL 0.1 MG/GM VA CREA
TOPICAL_CREAM | VAGINAL | Status: DC | PRN
  Administered 2014-11-25: 1 via VAGINAL

## 2014-11-25 MED ORDER — AZELASTINE-FLUTICASONE 137-50 MCG/ACT NA SUSP: 1.0000 | Freq: Two times a day (BID) | NASAL | Status: DC

## 2014-11-25 MED ORDER — SCOPOLAMINE 1 MG/3DAYS TD PT72
MEDICATED_PATCH | TRANSDERMAL | Status: AC
  Filled 2014-11-25: qty 1

## 2014-11-25 MED ORDER — GLYCOPYRROLATE 0.2 MG/ML IJ SOLN
INTRAMUSCULAR | Status: DC | PRN
  Administered 2014-11-25: 0.6 mg via INTRAVENOUS

## 2014-11-25 MED ORDER — SODIUM CHLORIDE 0.9 % IJ SOLN
INTRAMUSCULAR | Status: AC
  Filled 2014-11-25: qty 10

## 2014-11-25 MED ORDER — GLYCOPYRROLATE 0.2 MG/ML IJ SOLN
INTRAMUSCULAR | Status: AC
  Filled 2014-11-25: qty 4

## 2014-11-25 MED ORDER — METHYLENE BLUE 1 % INJ SOLN
INTRAMUSCULAR | Status: DC | PRN
  Administered 2014-11-25: 8 mL

## 2014-11-25 MED ORDER — LIDOCAINE HCL (CARDIAC) 20 MG/ML IV SOLN
INTRAVENOUS | Status: DC | PRN
  Administered 2014-11-25: 60 mg via INTRAVENOUS

## 2014-11-25 MED ORDER — SODIUM CHLORIDE 0.9 % IJ SOLN
INTRAMUSCULAR | Status: AC
Start: 2014-11-25 — End: 2014-11-25
  Filled 2014-11-25: qty 50

## 2014-11-25 MED ORDER — SUFENTANIL CITRATE 50 MCG/ML IV SOLN
INTRAVENOUS | Status: DC | PRN
  Administered 2014-11-25 (×2): 10 ug via INTRAVENOUS
  Administered 2014-11-25: 5 ug via INTRAVENOUS
  Administered 2014-11-25: 10 ug via INTRAVENOUS
  Administered 2014-11-25: 15 ug via INTRAVENOUS

## 2014-11-25 MED ORDER — BUPROPION HCL ER (SR) 150 MG PO TB12
150.0000 mg | ORAL_TABLET | Freq: Two times a day (BID) | ORAL | Status: DC
  Administered 2014-11-26: 150 mg via ORAL
  Filled 2014-11-25 (×2): qty 1

## 2014-11-25 MED ORDER — SCOPOLAMINE 1 MG/3DAYS TD PT72
1.0000 | MEDICATED_PATCH | Freq: Once | TRANSDERMAL | Status: DC
  Administered 2014-11-25: 1.5 mg via TRANSDERMAL

## 2014-11-25 MED ORDER — ARTIFICIAL TEARS OP OINT
TOPICAL_OINTMENT | OPHTHALMIC | Status: AC
  Filled 2014-11-25: qty 3.5

## 2014-11-25 MED ORDER — LACTATED RINGERS IV SOLN
INTRAVENOUS | Status: DC
  Administered 2014-11-25 – 2014-11-26 (×3): via INTRAVENOUS

## 2014-11-25 MED ORDER — PANTOPRAZOLE SODIUM 40 MG PO TBEC
40.0000 mg | DELAYED_RELEASE_TABLET | Freq: Every day | ORAL | Status: DC
  Administered 2014-11-26: 40 mg via ORAL
  Filled 2014-11-25: qty 1

## 2014-11-25 MED ORDER — LORAZEPAM 1 MG PO TABS: 1.0000 mg | ORAL_TABLET | Freq: Every day | ORAL | Status: DC | PRN

## 2014-11-25 MED ORDER — MIDAZOLAM HCL 2 MG/2ML IJ SOLN
INTRAMUSCULAR | Status: AC
  Filled 2014-11-25: qty 2

## 2014-11-25 MED ORDER — ROCURONIUM BROMIDE 100 MG/10ML IV SOLN
INTRAVENOUS | Status: DC | PRN
  Administered 2014-11-25 (×2): 20 mg via INTRAVENOUS
  Administered 2014-11-25: 30 mg via INTRAVENOUS
  Administered 2014-11-25: 10 mg via INTRAVENOUS

## 2014-11-25 MED ORDER — ESTRADIOL 0.1 MG/GM VA CREA
TOPICAL_CREAM | VAGINAL | Status: AC
  Filled 2014-11-25: qty 42.5

## 2014-11-25 MED ORDER — LIDOCAINE-EPINEPHRINE 0.5 %-1:200000 IJ SOLN
INTRAMUSCULAR | Status: AC
  Filled 2014-11-25: qty 1

## 2014-11-25 MED ORDER — LACTATED RINGERS IR SOLN
Status: DC | PRN
  Administered 2014-11-25: 3000 mL

## 2014-11-25 SURGICAL SUPPLY — 92 items
BARRIER ADHS 3X4 INTERCEED (GAUZE/BANDAGES/DRESSINGS) ×8 IMPLANT
BENZOIN TINCTURE PRP APPL 2/3 (GAUZE/BANDAGES/DRESSINGS) ×8 IMPLANT
BLADE SURG 15 STRL LF C SS BP (BLADE) ×6 IMPLANT
BLADE SURG 15 STRL SS (BLADE) ×2
CANISTER SUCT 3000ML (MISCELLANEOUS) ×8 IMPLANT
CATH BONANNO SUPRAPUBIC 14G (CATHETERS) IMPLANT
CLOSURE WOUND 1/2 X4 (GAUZE/BANDAGES/DRESSINGS) ×1
CLOTH BEACON ORANGE TIMEOUT ST (SAFETY) ×8 IMPLANT
CONT PATH 16OZ SNAP LID 3702 (MISCELLANEOUS) ×8 IMPLANT
COVER BACK TABLE 60X90IN (DRAPES) ×16 IMPLANT
COVER TIP SHEARS 8 DVNC (MISCELLANEOUS) ×6 IMPLANT
COVER TIP SHEARS 8MM DA VINCI (MISCELLANEOUS) ×2
DECANTER SPIKE VIAL GLASS SM (MISCELLANEOUS) ×8 IMPLANT
DRAPE WARM FLUID 44X44 (DRAPE) ×8 IMPLANT
DRSG OPSITE POSTOP 3X4 (GAUZE/BANDAGES/DRESSINGS) ×8 IMPLANT
DURAPREP 26ML APPLICATOR (WOUND CARE) ×8 IMPLANT
ELECT REM PT RETURN 9FT ADLT (ELECTROSURGICAL) ×8
ELECTRODE REM PT RTRN 9FT ADLT (ELECTROSURGICAL) ×6 IMPLANT
GAUZE PACKING 1 X5 YD ST (GAUZE/BANDAGES/DRESSINGS) IMPLANT
GAUZE PACKING 2X5 YD STRL (GAUZE/BANDAGES/DRESSINGS) ×8 IMPLANT
GAUZE VASELINE 3X9 (GAUZE/BANDAGES/DRESSINGS) IMPLANT
GLOVE BIO SURGEON STRL SZ 6.5 (GLOVE) ×7 IMPLANT
GLOVE BIO SURGEON STRL SZ7 (GLOVE) ×40 IMPLANT
GLOVE BIO SURGEONS STRL SZ 6.5 (GLOVE) ×1
GLOVE BIOGEL PI IND STRL 6.5 (GLOVE) ×6 IMPLANT
GLOVE BIOGEL PI IND STRL 7.0 (GLOVE) ×42 IMPLANT
GLOVE BIOGEL PI INDICATOR 6.5 (GLOVE) ×2
GLOVE BIOGEL PI INDICATOR 7.0 (GLOVE) ×14
GLOVE ECLIPSE 6.5 STRL STRAW (GLOVE) ×24 IMPLANT
GOWN STRL REUS W/TWL LRG LVL3 (GOWN DISPOSABLE) ×32 IMPLANT
GRASPER BIPOLAR FEN DA VINCI (INSTRUMENTS)
GRASPER BPLR FEN DVNC (INSTRUMENTS) IMPLANT
GYRUS RUMI II 2.5CM BLUE (DISPOSABLE)
GYRUS RUMI II 3.5CM BLUE (DISPOSABLE)
GYRUS RUMI II 4.0CM BLUE (DISPOSABLE)
KIT ACCESSORY DA VINCI DISP (KITS) ×2
KIT ACCESSORY DVNC DISP (KITS) ×6 IMPLANT
LEGGING LITHOTOMY PAIR STRL (DRAPES) ×8 IMPLANT
LIQUID BAND (GAUZE/BANDAGES/DRESSINGS) ×8 IMPLANT
MANIPULATOR UTERINE 4.5 ZUMI (MISCELLANEOUS) IMPLANT
NEEDLE HYPO 22GX1.5 SAFETY (NEEDLE) ×8 IMPLANT
NEEDLE INSUFFLATION 120MM (ENDOMECHANICALS) ×8 IMPLANT
NEEDLE SPNL 22GX3.5 QUINCKE BK (NEEDLE) IMPLANT
NS IRRIG 1000ML POUR BTL (IV SOLUTION) ×8 IMPLANT
OCCLUDER COLPOPNEUMO (BALLOONS) ×8 IMPLANT
PACK ROBOT WH (CUSTOM PROCEDURE TRAY) ×8 IMPLANT
PACK ROBOTIC GOWN (GOWN DISPOSABLE) ×8 IMPLANT
PACK VAGINAL WOMENS (CUSTOM PROCEDURE TRAY) ×8 IMPLANT
PAD POSITIONER PINK NONSTERILE (MISCELLANEOUS) ×8 IMPLANT
PAD PREP 24X48 CUFFED NSTRL (MISCELLANEOUS) ×16 IMPLANT
PLUG CATH AND CAP STER (CATHETERS) ×8 IMPLANT
RUMI II 3.0CM BLUE KOH-EFFICIE (DISPOSABLE) ×8 IMPLANT
RUMI II GYRUS 2.5CM BLUE (DISPOSABLE) IMPLANT
RUMI II GYRUS 3.5CM BLUE (DISPOSABLE) IMPLANT
RUMI II GYRUS 4.0CM BLUE (DISPOSABLE) IMPLANT
SET CYSTO W/LG BORE CLAMP LF (SET/KITS/TRAYS/PACK) ×8 IMPLANT
SET IRRIG TUBING LAPAROSCOPIC (IRRIGATION / IRRIGATOR) ×8 IMPLANT
SET TRI-LUMEN FLTR TB AIRSEAL (TUBING) ×8 IMPLANT
SLING TRANS VAGINAL TAPE (Sling) ×2 IMPLANT
SLING UTERINE/ABD GYNECARE TVT (Sling) ×6 IMPLANT
STRIP CLOSURE SKIN 1/2X4 (GAUZE/BANDAGES/DRESSINGS) ×7 IMPLANT
SUT DVC VLOC 180 0 12IN GS21 (SUTURE)
SUT VIC AB 0 CT1 18XCR BRD8 (SUTURE) ×6 IMPLANT
SUT VIC AB 0 CT1 27 (SUTURE)
SUT VIC AB 0 CT1 27XBRD ANBCTR (SUTURE) IMPLANT
SUT VIC AB 0 CT1 8-18 (SUTURE) ×2
SUT VIC AB 2-0 CT1 27 (SUTURE) ×4
SUT VIC AB 2-0 CT1 TAPERPNT 27 (SUTURE) ×12 IMPLANT
SUT VIC AB 2-0 CT2 27 (SUTURE) ×16 IMPLANT
SUT VIC AB 2-0 SH 27 (SUTURE) ×4
SUT VIC AB 2-0 SH 27XBRD (SUTURE) ×12 IMPLANT
SUT VIC AB 2-0 UR6 27 (SUTURE) ×8 IMPLANT
SUT VICRYL RAPIDE 3 0 (SUTURE) ×16 IMPLANT
SUT VLOC 180 0 9IN  GS21 (SUTURE) ×2
SUT VLOC 180 0 9IN GS21 (SUTURE) ×6 IMPLANT
SUTURE DVC VLC 180 0 12IN GS21 (SUTURE) IMPLANT
SYR 50ML LL SCALE MARK (SYRINGE) ×16 IMPLANT
SYSTEM CONVERTIBLE TROCAR (TROCAR) ×8 IMPLANT
TIP RUMI ORANGE 6.7MMX12CM (TIP) IMPLANT
TIP UTERINE 5.1X6CM LAV DISP (MISCELLANEOUS) IMPLANT
TIP UTERINE 6.7X10CM GRN DISP (MISCELLANEOUS) IMPLANT
TIP UTERINE 6.7X6CM WHT DISP (MISCELLANEOUS) IMPLANT
TIP UTERINE 6.7X8CM BLUE DISP (MISCELLANEOUS) ×8 IMPLANT
TOWEL OR 17X24 6PK STRL BLUE (TOWEL DISPOSABLE) ×16 IMPLANT
TRAY FOLEY CATH SILVER 14FR (SET/KITS/TRAYS/PACK) ×8 IMPLANT
TROCAR DILATING TIP 12MM 150MM (ENDOMECHANICALS) ×8 IMPLANT
TROCAR DISP BLADELESS 8 DVNC (TROCAR) ×6 IMPLANT
TROCAR DISP BLADELESS 8MM (TROCAR) ×2
TROCAR PORT AIRSEAL 5X120 (TROCAR) ×8 IMPLANT
TROCAR XCEL 12X100 BLDLESS (ENDOMECHANICALS) ×8 IMPLANT
TROCAR XCEL NON-BLD 5MMX100MML (ENDOMECHANICALS) ×8 IMPLANT
WATER STERILE IRR 1000ML POUR (IV SOLUTION) ×24 IMPLANT

## 2014-11-25 NOTE — Anesthesia Postprocedure Evaluation (Signed)
  Anesthesia Post-op Note  Patient: Sabrina Welch  Procedure(s) Performed: Procedure(s) (LRB): ROBOTIC ASSISTED TOTAL HYSTERECTOMY (N/A) BILATERAL SALPINGECTOMY (Bilateral) TRANSVAGINAL TAPE (TVT) PROCEDURE WITH ANTERIOR REPAIR (N/A) CYSTOSCOPY (N/A) OOPHORECTOMY (Right)  Patient Location: PACU  Anesthesia Type: General  Level of Consciousness: awake and alert   Airway and Oxygen Therapy: Patient Spontanous Breathing  Post-op Pain: mild  Post-op Assessment: Post-op Vital signs reviewed, Patient's Cardiovascular Status Stable, Respiratory Function Stable, Patent Airway and No signs of Nausea or vomiting  Last Vitals:  Filed Vitals:   11/25/14 1045  BP: 119/72  Pulse: 95  Temp:   Resp: 13    Post-op Vital Signs: stable   Complications: No apparent anesthesia complications

## 2014-11-25 NOTE — Anesthesia Postprocedure Evaluation (Signed)
Anesthesia Post Note  Patient: Sabrina Welch  Procedure(s) Performed: Procedure(s): ROBOTIC ASSISTED TOTAL HYSTERECTOMY (N/A) BILATERAL SALPINGECTOMY (Bilateral) TRANSVAGINAL TAPE (TVT) PROCEDURE WITH ANTERIOR REPAIR (N/A) CYSTOSCOPY (N/A) OOPHORECTOMY (Right)  Anesthesia type: General  Patient location: [facility] Unit  Post pain: Pain level controlled  Post assessment: Post-op Vital signs reviewed  Last Vitals: BP 126/79 mmHg  Pulse 84  Temp(Src) 36.7 C (Oral)  Resp 16  SpO2 95%  Post vital signs: Reviewed  Level of consciousness: awake  Complications: No apparent anesthesia complications

## 2014-11-25 NOTE — Anesthesia Procedure Notes (Signed)
Procedure Name: Intubation Date/Time: 11/25/2014 7:38 AM Performed by: Renford Dills Pre-anesthesia Checklist: Patient identified, Emergency Drugs available, Suction available and Patient being monitored Patient Re-evaluated:Patient Re-evaluated prior to inductionOxygen Delivery Method: Circle system utilized Preoxygenation: Pre-oxygenation with 100% oxygen Intubation Type: IV induction Ventilation: Mask ventilation without difficulty Laryngoscope Size: Miller and 2 Grade View: Grade I Tube type: Oral Tube size: 7.0 mm Number of attempts: 1 Airway Equipment and Method: Stylet Placement Confirmation: ETT inserted through vocal cords under direct vision,  positive ETCO2 and breath sounds checked- equal and bilateral Secured at: 20 cm Tube secured with: Tape Dental Injury: Teeth and Oropharynx as per pre-operative assessment

## 2014-11-25 NOTE — Addendum Note (Signed)
Addendum  created 11/25/14 1407 by Graciela Husbands, CRNA   Modules edited: Notes Section   Notes Section:  File: [id number]

## 2014-11-25 NOTE — Brief Op Note (Signed)
11/25/2014  10:12 AM  PATIENT:  Sabrina Welch  44 y.o. female  PRE-OPERATIVE DIAGNOSIS:  Menorrhagia,SUI, Cystoceleand Rectocele  POST-OPERATIVE DIAGNOSIS:  Menorrhagia, stress urinary incontience, cystocele, rectocele  PROCEDURE:  Procedure(s): ROBOTIC ASSISTED TOTAL HYSTERECTOMY (N/A) BILATERAL SALPINGECTOMY (Bilateral) TRANSVAGINAL TAPE (TVT) PROCEDURE WITH ANTERIOR REPAIR (N/A) CYSTOSCOPY (N/A) OOPHORECTOMY (Right)  SURGEON:  Surgeon(s) and Role:    * Carrington Clamp, MD - Primary    * Essie Hart, MD - Assisting  ANESTHESIA:   general  EBL:  Total I/O In: 1600 [I.V.:1600] Out: 325 [Urine:225; Blood:100]   SPECIMEN:  Source of Specimen:  uterus, R ovary, cervix, salpingectomies  DISPOSITION OF SPECIMEN:  PATHOLOGY  COUNTS:  YES  TOURNIQUET:  * No tourniquets in log *  DICTATION: .Note written in EPIC  PLAN OF CARE: Admit for overnight observation  PATIENT DISPOSITION:  PACU - hemodynamically stable.   Delay start of Pharmacological VTE agent (>24hrs) due to surgical blood loss or risk of bleeding: not applicable  Complications:  None.  Findings:  8 weeks size uterus.  R ovary has a large cyst and some clear plaques on it. L tube had small paraovarian cyst.  The ureters were identified during multiple points of the case and were always out of the field of dissection.  On cystoscopy, the bladder was intact and bilateral spill was seen from each ureteral orriface.    Medications:  Ancef. Exparel.     Technique:  After adequate anesthesia was achieved the patient was positioned, prepped and draped in usual sterile fashion.  A speculum was placed in the vagina and the cervix dilated with pratt dilators.  The 8 cm Rumi and 3 cm Koh ring were assembled and placed in proper fashion.  The  Speculum was removed and the bladder catheterized with a foley.    Attention was turned to the abdomen where a 1 cm incision was made 1 cm above the umbilicus.  The veress needle  was introduced without aspiration of bowel contents or blood and the abdomen insufflated. The long 12 mm trocar was placed and the other three trocar sites were marked out, all approximately 10 cm from each other and the camera.  Two 8.5 mm trocars were placed on either side of the camera port and a 5 mm assistant port was placed 3 cm above the line of the other trocars.  All trocars were inserted under direct visualization of the camera.  The patient was placed in trendelenburg and then the Robot docked.  The PK forceps were placed on arm 2 and the Hot shears on arm 1 and introduced under direct visualization of the camera.  I then broke scrub and sat down at the console.  The above findings were noted and the ureters identified well out of the field of dissection.  The right fallopian tube was isolated and cauterized with the PK.  The Utero-ovarian ligament was then divided with the PK cautery and shears.  The posterior broad ligament was then divided with the hot shears until the uterosacral ligament.  The Broad and cardinal ligaments were then cauterized against the cervix to the level of the Koh ring, securing the uterine artery.  Each pedicle was then incised with the shears.  The anterior leaf was then incised at the reflection of the vessico-uterine junction and the lateral bladder retracted inferiorly after the round ligament had been divided with the PK forceps.  The left tube was cauterized with the PK and divided with the shears;  then the left utero-ovarian ligament divided with the PK forceps and the scissors.  The round ligament was divided as well and the posterior leaf of the broad ligament then divided with the hot shears. The broad and cardinal ligaments were then cauterized on the left in the same way.   At the level of the internal os, the uterine arteries were bilaterally cauterized with the PK.  The ureters were identified well out of the field of dissection.  .   The bladder was then able  to be retracted inferiorly and the vesico-uterine fascia was incised in the midline until the bladder was removed one cm below the Koh ring.  The hot shears then circumferentially incised the vagina at the level of the reflection on the [facility] ring.  Once the uterus and cervix were amputated, cautery was used to insure hemostasis of the cuff.  Once hemostasis was achieved, the instruments were changed to the long forceps and mega suture cut needle driver and the cuff was closed with a running stitches of 0-vicryl V loc.  Cautery was used to ensure hemostasis of the right pedicles very superficially. The ureters were peristalsing bilaterally well and very lateral to the areas of operation.    The Robot was then undocked and I scrubbed back in.  The skin incisions were closed with subcuticular stitches of 3-0 vicryl Rapide and Dermabond. .  The apex of the cystocele grasped with two allises.  Allises were used to grasp the vaginal mucosa in the midline superior to inferior just below the urethral opening.  The mucosa was injected with 1% lidocaine with epi in the midline and a scalpel used to incise the vaginal mucosa vertically.  Allises were used to retract the vaginal mucosa as the vesico-vaginal fascia was removed from the mucosa with blunt and sharp dissection and adequate room midurethral to pelvic floor bilaterally was developed.  Two small puncture incisions were then made two cm from midline just above the pubic symphysis.  The abdominal needles from the Gynecare TVT set were introduced behind the pubic bone, through the pelvic floor and out the vagina carefully on each respective side, being careful to hug the posterior of the pubic bone.  The foley was removed and indigo carmine given.  Cystoscopy revealed excellent bilateral spill from each ureteral orifice and NO NEEDLES IN THE BLADDER.   The urethra was clear as well.  The foley was replaced and the vaginal needles attached to the abdominal needles.  The  tape was pulled into place through the abdominal incisions, the sheaths removed while a kelly was in place under the mesh sling and the tape cut at the level of just below the skin.  The tape was checked and found to be tight enough and laying flat.   Gelfoam  was placed in the corners up by the pelvic floor to achieve hemostasis of some small bleeders out of reach and too close to the sling for stitches.  Once hemostasis was achieved, three mattress stitches of 0-vicryl were placed to close the vesico-vaginal fascia under the bladder.  The vaginal mucosa was trimmed and then closed with a running lock stitch of 2-0 vicryl.  A vaginal packing with estrace was placed and the patient returned to the recovery room in stable condition.
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
## 2014-11-25 NOTE — Progress Notes (Signed)
There has been no change in the patients history, status or exam since the history and physical.  Filed Vitals:   11/25/14 0604  BP: 135/78  Pulse: 102  Temp: 98.1 F (36.7 C)  TempSrc: Oral  Resp: 20  SpO2: 98%    Lab Results  Component Value Date   WBC 10.5 11/19/2014   HGB 12.7 11/19/2014   HCT 38.9 11/19/2014   MCV 92.6 11/19/2014   PLT 311 11/19/2014    Kristi Norment A

## 2014-11-25 NOTE — Op Note (Signed)
11/25/2014  10:12 AM  PATIENT:  Sabrina Welch  44 y.o. female  PRE-OPERATIVE DIAGNOSIS:  Menorrhagia,SUI, Cystoceleand Rectocele  POST-OPERATIVE DIAGNOSIS:  Menorrhagia, stress urinary incontience, cystocele, rectocele  PROCEDURE:  Procedure(s): ROBOTIC ASSISTED TOTAL HYSTERECTOMY (N/A) BILATERAL SALPINGECTOMY (Bilateral) TRANSVAGINAL TAPE (TVT) PROCEDURE WITH ANTERIOR REPAIR (N/A) CYSTOSCOPY (N/A) OOPHORECTOMY (Right)  SURGEON:  Surgeon(s) and Role:    * Carrington Clamp, MD - Primary    * Essie Hart, MD - Assisting  ANESTHESIA:   general  EBL:  Total I/O In: 1600 [I.V.:1600] Out: 325 [Urine:225; Blood:100]   SPECIMEN:  Source of Specimen:  uterus, R ovary, cervix, salpingectomies  DISPOSITION OF SPECIMEN:  PATHOLOGY  COUNTS:  YES  TOURNIQUET:  * No tourniquets in log *  DICTATION: .Note written in EPIC  PLAN OF CARE: Admit for overnight observation  PATIENT DISPOSITION:  PACU - hemodynamically stable.   Delay start of Pharmacological VTE agent (>24hrs) due to surgical blood loss or risk of bleeding: not applicable  Complications:  None.  Findings:  8 weeks size uterus.  R ovary has a large cyst and some clear plaques on it. L tube had small paraovarian cyst.  The ureters were identified during multiple points of the case and were always out of the field of dissection.  On cystoscopy, the bladder was intact and bilateral spill was seen from each ureteral orriface.    Medications:  Ancef. Exparel.     Technique:  After adequate anesthesia was achieved the patient was positioned, prepped and draped in usual sterile fashion.  A speculum was placed in the vagina and the cervix dilated with pratt dilators.  The 8 cm Rumi and 3 cm Koh ring were assembled and placed in proper fashion.  The  Speculum was removed and the bladder catheterized with a foley.    Attention was turned to the abdomen where a 1 cm incision was made 1 cm above the umbilicus.  The veress needle  was introduced without aspiration of bowel contents or blood and the abdomen insufflated. The long 12 mm trocar was placed and the other three trocar sites were marked out, all approximately 10 cm from each other and the camera.  Two 8.5 mm trocars were placed on either side of the camera port and a 5 mm assistant port was placed 3 cm above the line of the other trocars.  All trocars were inserted under direct visualization of the camera.  The patient was placed in trendelenburg and then the Robot docked.  The PK forceps were placed on arm 2 and the Hot shears on arm 1 and introduced under direct visualization of the camera.  I then broke scrub and sat down at the console.  The above findings were noted and the ureters identified well out of the field of dissection.  The right IP ligament was isolated and cauterized with the PK.  The IP ligament was then divided with the PK cautery and shears.  The posterior broad ligament was then divided with the hot shears until the uterosacral ligament after dissecting out the ureter and dropping it away.  The Broad and cardinal ligaments were then cauterized against the cervix to the level of the Koh ring, securing the uterine artery.  Each pedicle was then incised with the shears.  The anterior leaf was then incised at the reflection of the vessico-uterine junction and the lateral bladder retracted inferiorly after the round ligament had been divided with the PK forceps.  The left tube was cauterized  with the PK and divided with the shears;  then the left utero-ovarian ligament divided with the PK forceps and the scissors.  The round ligament was divided as well and the posterior leaf of the broad ligament then divided with the hot shears. The broad and cardinal ligaments were then cauterized on the left in the same way.   At the level of the internal os, the uterine arteries were bilaterally cauterized with the PK.  The ureters were identified well out of the field of  dissection.  .   The bladder was then able to be retracted inferiorly and the vesico-uterine fascia was incised in the midline until the bladder was removed one cm below the Koh ring.  The hot shears then circumferentially incised the vagina at the level of the reflection on the [facility] ring.  Once the uterus and cervix were amputated, cautery was used to insure hemostasis of the cuff.  Once hemostasis was achieved, the instruments were changed to the long forceps and mega suture cut needle driver and the cuff was closed with a running stitches of 0-vicryl V loc.  Cautery was used to ensure hemostasis of the right pedicles very superficially. The ureters were peristalsing bilaterally well and very lateral to the areas of operation.    The Robot was then undocked and I scrubbed back in.  The skin incisions were closed with subcuticular stitches of 3-0 vicryl Rapide and Dermabond. .  The apex of the cystocele grasped with two allises.  Allises were used to grasp the vaginal mucosa in the midline superior to inferior just below the urethral opening.  The mucosa was injected with 1% lidocaine with epi in the midline and a scalpel used to incise the vaginal mucosa vertically.  Allises were used to retract the vaginal mucosa as the vesico-vaginal fascia was removed from the mucosa with blunt and sharp dissection and adequate room midurethral to pelvic floor bilaterally was developed.  Two small puncture incisions were then made two cm from midline just above the pubic symphysis.  The abdominal needles from the Gynecare TVT set were introduced behind the pubic bone, through the pelvic floor and out the vagina carefully on each respective side, being careful to hug the posterior of the pubic bone.  The foley was removed and indigo carmine given.  Cystoscopy revealed excellent bilateral spill from each ureteral orifice and NO NEEDLES IN THE BLADDER.   The urethra was clear as well.  The foley was replaced and the vaginal  needles attached to the abdominal needles.  The tape was pulled into place through the abdominal incisions, the sheaths removed while a kelly was in place under the mesh sling and the tape cut at the level of just below the skin.  The tape was checked and found to be tight enough and laying flat.   Gelfoam  was placed in the corners up by the pelvic floor to achieve hemostasis of some small bleeders out of reach and too close to the sling for stitches.  Once hemostasis was achieved, three mattress stitches of 0-vicryl were placed to close the vesico-vaginal fascia under the bladder.  The vaginal mucosa was trimmed and then closed with a running lock stitch of 2-0 vicryl.  A vaginal packing with estrace was placed and the patient returned to the recovery room in stable condition.

## 2014-11-25 NOTE — Transfer of Care (Signed)
Immediate Anesthesia Transfer of Care Note  Patient: Sabrina Welch  Procedure(s) Performed: Procedure(s): ROBOTIC ASSISTED TOTAL HYSTERECTOMY (N/A) BILATERAL SALPINGECTOMY (Bilateral) TRANSVAGINAL TAPE (TVT) PROCEDURE WITH ANTERIOR REPAIR (N/A) CYSTOSCOPY (N/A) OOPHORECTOMY (Right)  Patient Location: PACU  Anesthesia Type:General  Level of Consciousness: awake and sedated  Airway & Oxygen Therapy: Patient Spontanous Breathing and Patient connected to nasal cannula oxygen  Post-op Assessment: Report given to RN and Post -op Vital signs reviewed and stable  Post vital signs: stable  Last Vitals:  Filed Vitals:   11/25/14 0604  BP: 135/78  Pulse: 102  Temp: 36.7 C  Resp: 20    Complications: No apparent anesthesia complications

## 2014-11-26 ENCOUNTER — Encounter (HOSPITAL_COMMUNITY): Payer: Self-pay | Admitting: Obstetrics and Gynecology

## 2014-11-26 DIAGNOSIS — N920 Excessive and frequent menstruation with regular cycle: Secondary | ICD-10-CM | POA: Diagnosis not present

## 2014-11-26 MED ORDER — OXYCODONE-ACETAMINOPHEN 5-325 MG PO TABS: 1.0000 | ORAL_TABLET | ORAL | Status: DC | PRN

## 2014-11-26 MED ORDER — CEFUROXIME AXETIL 250 MG PO TABS: 250.0000 mg | ORAL_TABLET | Freq: Two times a day (BID) | ORAL | Status: DC

## 2014-11-26 MED ORDER — TRAMADOL HCL 50 MG PO TABS
50.0000 mg | ORAL_TABLET | Freq: Four times a day (QID) | ORAL | Status: DC
  Administered 2014-11-26: 50 mg via ORAL
  Filled 2014-11-26: qty 1

## 2014-11-26 MED ORDER — IBUPROFEN 800 MG PO TABS: 800.0000 mg | ORAL_TABLET | Freq: Three times a day (TID) | ORAL | Status: DC | PRN

## 2014-11-26 MED ORDER — PROMETHAZINE HCL 25 MG PO TABS: ORAL_TABLET | ORAL | Status: DC

## 2014-11-26 NOTE — Discharge Instructions (Signed)
Keep Foley in place- give patient leg bag and show how to change bags.  Instruct on care. voiding trial at office- call and come in at 830 am °

## 2014-11-26 NOTE — Discharge Summary (Signed)
Physician Discharge Summary  Patient ID: Sabrina Welch MRN: [id number] DOB/AGE: [birth year] 44 y.o.  Admit date: 11/25/2014 Discharge date: 11/26/2014  Admission Diagnoses:  Discharge Diagnoses:  Active Problems:   Postoperative state   Discharged Condition: good  Hospital Course: Uncomplicated Robotic TLH/RSO/Salpingectomy/TVT/A repair/cysto.    Consults: None  Significant Diagnostic Studies: none  Treatments: surgery:  Robotic TLH/RSO/Salpingectomy/TVT/A repair/cysto.  Discharge Exam: Blood pressure 133/68, pulse 84, temperature 99.1 F (37.3 C), temperature source Oral, resp. rate 16, height 5\' 9"  (1.753 m), weight 113.853 kg (251 lb), SpO2 98 %.   Disposition: 01-Home or Self Care  Discharge Instructions    Call MD for:  temperature >100.4    Complete by:  As directed      Diet - low sodium heart healthy    Complete by:  As directed      Discharge instructions    Complete by:  As directed   No driving on narcotics, no sexual activity for 2 weeks.     Increase activity slowly    Complete by:  As directed      May shower / Bathe    Complete by:  As directed   Shower, no bath for 2 weeks.     Remove dressing in 24 hours    Complete by:  As directed      Sexual Activity Restrictions    Complete by:  As directed   No sexual activity for 2 weeks.            Medication List    TAKE these medications        acetaminophen 500 MG tablet  Commonly known as:  TYLENOL  Take 1,000 mg by mouth every 6 (six) hours as needed for moderate pain.     atorvastatin 10 MG tablet  Commonly known as:  LIPITOR  Take 1 tablet (10 mg total) by mouth daily.     Azelastine-Fluticasone 137-50 MCG/ACT Susp  Commonly known as:  DYMISTA  Place 1 puff into the nose 2 (two) times daily.     buPROPion 150 MG 12 hr tablet  Commonly known as:  WELLBUTRIN SR  Take 150 mg by mouth 2 (two) times daily.     diclofenac 75 MG EC tablet  Commonly known as:  VOLTAREN  Take 1 tablet (75  mg total) by mouth 2 (two) times daily.     dicyclomine 10 MG capsule  Commonly known as:  BENTYL  Take 1 capsule (10 mg total) by mouth 2 (two) times daily as needed.     diphenoxylate-atropine 2.5-0.025 MG per tablet  Commonly known as:  LOMOTIL  Take one po TID prn diarrhea     esomeprazole 40 MG capsule  Commonly known as:  NEXIUM  Take 1 capsule (40 mg total) by mouth 2 (two) times daily.     ibuprofen 800 MG tablet  Commonly known as:  ADVIL,MOTRIN  Take 1 tablet (800 mg total) by mouth every 8 (eight) hours as needed (mild pain).     LORazepam 1 MG tablet  Commonly known as:  ATIVAN  Take 1 tablet by mouth daily as needed for anxiety.     mirabegron ER 50 MG Tb24 tablet  Commonly known as:  MYRBETRIQ  Take 1 tablet (50 mg total) by mouth daily.     mometasone-formoterol 200-5 MCG/ACT Aero  Commonly known as:  DULERA  Take 2 puffs first thing in am and then another 2 puffs about 12 hours later.  ondansetron 4 MG disintegrating tablet  Commonly known as:  ZOFRAN ODT  Take 1 tablet (4 mg total) by mouth every 8 (eight) hours as needed for nausea or vomiting.     oxyCODONE-acetaminophen 5-325 MG per tablet  Commonly known as:  PERCOCET/ROXICET  Take 1-2 tablets by mouth every 4 (four) hours as needed for severe pain (moderate to severe pain (when tolerating fluids)).     PROAIR HFA 108 (90 BASE) MCG/ACT inhaler  Generic drug:  albuterol  USE 2 INHALATIONS ORALLY   EVERY 6 HOURS AS NEEDED FORWHEEZING OR SHORTNESS OF   BREATH     promethazine 25 MG tablet  Commonly known as:  PHENERGAN  Take 1/2 tablet by mouth every 6 hours as needed     traMADol 50 MG tablet  Commonly known as:  ULTRAM  Take 1 tablet (50 mg total) by mouth 3 (three) times daily.     Vilazodone HCl 40 MG Tabs  Commonly known as:  VIIBRYD  Take 40 mg by mouth daily.     zolpidem 10 MG tablet  Commonly known as:  AMBIEN  Take 1 tablet by mouth every evening.           Follow-up  Information    Follow up with Cheron Pasquarelli A, MD.   Specialty:  Obstetrics and Gynecology   Why:  monday for voiding trial   Contact information:   [address] [place] [address] [phone]       Signed: Antwon Rochin A 11/26/2014, 7:50 AM

## 2014-11-26 NOTE — Progress Notes (Signed)
Patient is eating, ambulating, not voiding.  Pain control is good.  BP 133/68 mmHg  Pulse 84  Temp(Src) 99.1 F (37.3 C) (Oral)  Resp 16  Ht  (1.753 m)  Wt 113.853 kg (251 lb)  BMI 37.05 kg/m2  SpO2 98%  lungs:   clear to auscultation cor:    RRR Abdomen:  soft, appropriate tenderness, incisions intact and without erythema or exudate. ex:    no cords   Lab Results  Component Value Date   WBC 10.5 11/19/2014   HGB 12.7 11/19/2014   HCT 38.9 11/19/2014   MCV 92.6 11/19/2014   PLT 311 11/19/2014    A/P  Routine care.  Expect d/c per plan.  Pt is sometimes nauseated but will try to get her off IV meds.  Ambulate.

## 2014-11-26 NOTE — Progress Notes (Signed)
Teaching complete  Leg bag sent home with pt  And instructions given   Out in wheel chair

## 2014-12-01 ENCOUNTER — Telehealth: Payer: Self-pay | Admitting: *Deleted

## 2014-12-01 NOTE — Telephone Encounter (Signed)
Received a notice informing of possible adverse effects of Promethazine and Tramadol, possible seizure risk.  I reviewed pt's medication list and she was prescribed Promethazine and Oxycodone by Dr. Henderson Cloud post-op hysterectomy.  I instructed pt not to take the Tramadol with the Promethazine due to health risk, advised by pt's pharmacy [facility].  Pt states Dr. Henderson Cloud took her off the Tramadol, because of the the post-op pain medications to be prescribed.

## 2014-12-08 ENCOUNTER — Encounter: Payer: Self-pay | Admitting: Podiatry

## 2014-12-08 DIAGNOSIS — M722 Plantar fascial fibromatosis: Secondary | ICD-10-CM | POA: Diagnosis not present

## 2014-12-09 ENCOUNTER — Telehealth: Payer: Self-pay | Admitting: *Deleted

## 2014-12-09 NOTE — Telephone Encounter (Signed)
Pt states the top of her foot hurts beneath the ace.  I encouraged pt to remove the ace wrap only, elevate the surgical foot 15 mins then rewrap the foot looser.  Pt states understanding and that everything else seems okay.  I encouraged pt to call with concerns.

## 2014-12-10 NOTE — Progress Notes (Signed)
DOS 12/08/2014 Endoscopic release medial band left heel. 

## 2014-12-15 ENCOUNTER — Other Ambulatory Visit: Payer: BLUE CROSS/BLUE SHIELD

## 2014-12-17 ENCOUNTER — Encounter: Payer: Self-pay | Admitting: Podiatry

## 2014-12-17 ENCOUNTER — Ambulatory Visit (INDEPENDENT_AMBULATORY_CARE_PROVIDER_SITE_OTHER): Payer: BLUE CROSS/BLUE SHIELD | Admitting: Podiatry

## 2014-12-17 VITALS — BP 126/82 | HR 109 | Resp 16

## 2014-12-17 DIAGNOSIS — M79672 Pain in left foot: Secondary | ICD-10-CM

## 2014-12-17 DIAGNOSIS — M722 Plantar fascial fibromatosis: Secondary | ICD-10-CM

## 2014-12-18 NOTE — Progress Notes (Signed)
Subjective:     Patient ID: Sabrina Welch, female   DOB: 23-Mar-1971, 44 y.o.   MRN: [id number]  HPI patient states I'm doing well with my heel with minimal discomfort and able to walk with not too much swelling   Review of Systems     Objective:   Physical Exam Neurovascular status intact incision site plantar left lateral and medial side is doing well with wound edges well coapted and no drainage or redness. Patient has mild discomfort when the arch and heel are palpated but not any sharp pain and there is a negative Homans sign noted    Assessment:     Doing well post endoscopic release medial fascial band left    Plan:     H&P and condition reviewed and at this time recommended continued compression continued elevation and boot usage even though I dispensed surgical shoe she can start to wear. Patient will return 2 weeks for suture removal or earlier if any issues should occur

## 2014-12-31 ENCOUNTER — Ambulatory Visit (INDEPENDENT_AMBULATORY_CARE_PROVIDER_SITE_OTHER): Payer: BLUE CROSS/BLUE SHIELD

## 2014-12-31 VITALS — BP 108/74 | HR 114 | Temp 97.9°F | Resp 16

## 2014-12-31 DIAGNOSIS — Z9889 Other specified postprocedural states: Secondary | ICD-10-CM

## 2014-12-31 DIAGNOSIS — M722 Plantar fascial fibromatosis: Secondary | ICD-10-CM

## 2014-12-31 NOTE — Progress Notes (Signed)
Pt presents post op left foot EPF medial fascial band. Pt states she is still having some discomfort in her foot. She continues to transition between boot and darco shoe. Sutures were removed. Wound edges aligned and approximated. No drainage noted. Pt denied fever, chills, n/v. Advised pt to continue to transition between boot and shoe for next 2 weeks when returning to work and she could gradually transition from darco shoe to supportive tennis shoe when she feels ready. Re-appointed to followup with Dr Charlsie Merles in 3 weeks

## 2015-01-22 ENCOUNTER — Ambulatory Visit (INDEPENDENT_AMBULATORY_CARE_PROVIDER_SITE_OTHER): Payer: BLUE CROSS/BLUE SHIELD | Admitting: Podiatry

## 2015-01-22 VITALS — BP 126/92 | HR 75 | Resp 16

## 2015-01-22 DIAGNOSIS — M722 Plantar fascial fibromatosis: Secondary | ICD-10-CM

## 2015-01-22 DIAGNOSIS — Z9889 Other specified postprocedural states: Secondary | ICD-10-CM

## 2015-01-22 MED ORDER — TRAMADOL HCL 50 MG PO TABS
50.0000 mg | ORAL_TABLET | Freq: Three times a day (TID) | ORAL | Status: DC
Start: 2015-01-22 — End: 2015-06-29

## 2015-01-24 NOTE — Progress Notes (Signed)
Subjective:     Patient ID: Sabrina Welch, female   DOB: [birth year], 44 y.o.   MRN: [id number]  HPI patient states my heel is doing pretty well but I still have some discomfort on the other one that I wanted to get checked. States that she's had minimal swelling or pain with ambulation   Review of Systems     Objective:   Physical Exam Neurovascular status intact muscle strength adequate with patient having discomfort in the plantar fascia right but well-healing surgical site left was stitches intact and wound edges well coapted    Assessment:     Doing well post endoscopic surgery left with improvement noted and good heel toe gait with pain of a mild to moderate nature right plantar fascia    Plan:     Reapplied sterile dressing left and continue with immobilization and for the right went ahead and reviewed continued physical therapy stretching exercises and reappoint to recheck

## 2015-06-09 ENCOUNTER — Other Ambulatory Visit (INDEPENDENT_AMBULATORY_CARE_PROVIDER_SITE_OTHER): Payer: BLUE CROSS/BLUE SHIELD

## 2015-06-09 ENCOUNTER — Telehealth: Payer: Self-pay | Admitting: Internal Medicine

## 2015-06-09 DIAGNOSIS — D509 Iron deficiency anemia, unspecified: Secondary | ICD-10-CM

## 2015-06-09 LAB — CBC WITH DIFFERENTIAL/PLATELET
Basophils Absolute: 0.1 10*3/uL (ref 0.0–0.1)
Basophils Relative: 1 % (ref 0.0–3.0)
EOS PCT: 5.6 % — AB (ref 0.0–5.0)
Eosinophils Absolute: 0.4 10*3/uL (ref 0.0–0.7)
HCT: 40.6 % (ref 36.0–46.0)
Hemoglobin: 13.6 g/dL (ref 12.0–15.0)
Lymphocytes Relative: 30 % (ref 12.0–46.0)
Lymphs Abs: 2.1 10*3/uL (ref 0.7–4.0)
MCHC: 33.5 g/dL (ref 30.0–36.0)
MCV: 86.6 fl (ref 78.0–100.0)
Monocytes Absolute: 0.7 10*3/uL (ref 0.1–1.0)
Monocytes Relative: 10 % (ref 3.0–12.0)
NEUTROS PCT: 53.4 % (ref 43.0–77.0)
Neutro Abs: 3.7 10*3/uL (ref 1.4–7.7)
Platelets: 326 10*3/uL (ref 150.0–400.0)
RBC: 4.69 Mil/uL (ref 3.87–5.11)
RDW: 14.2 % (ref 11.5–15.5)
WBC: 7 10*3/uL (ref 4.0–10.5)

## 2015-06-09 LAB — IBC PANEL
Iron: 60 ug/dL (ref 42–145)
SATURATION RATIOS: 16.4 % — AB (ref 20.0–50.0)
TRANSFERRIN: 261 mg/dL (ref 212.0–360.0)

## 2015-06-09 LAB — FERRITIN: Ferritin: 73 ng/mL (ref 10.0–291.0)

## 2015-06-09 NOTE — Telephone Encounter (Signed)
Patient wants to have Dr. Adela Lank as new GI. Former Psychologist, counselling patient with IDA, GERD, IBS. She is asking if she needs to have labs drawn because Dr. Juanda Chance followed her labs. Please, advise.

## 2015-06-09 NOTE — Telephone Encounter (Signed)
Sure. CBC and iron panel, with ferritin. We will let her know the results. Thanks

## 2015-06-09 NOTE — Telephone Encounter (Signed)
Labs in EPIC. Patient aware and she will come for labs.

## 2015-06-09 NOTE — Telephone Encounter (Signed)
Left a message for patient to call back. 

## 2015-06-21 ENCOUNTER — Ambulatory Visit (INDEPENDENT_AMBULATORY_CARE_PROVIDER_SITE_OTHER): Payer: BLUE CROSS/BLUE SHIELD

## 2015-06-21 ENCOUNTER — Ambulatory Visit (INDEPENDENT_AMBULATORY_CARE_PROVIDER_SITE_OTHER): Payer: BLUE CROSS/BLUE SHIELD | Admitting: Podiatry

## 2015-06-21 DIAGNOSIS — R52 Pain, unspecified: Secondary | ICD-10-CM

## 2015-06-21 DIAGNOSIS — M722 Plantar fascial fibromatosis: Secondary | ICD-10-CM

## 2015-06-21 MED ORDER — PREDNISONE 10 MG PO TABS
ORAL_TABLET | ORAL | Status: DC
Start: 2015-06-21 — End: 2015-07-12

## 2015-06-21 MED ORDER — TRIAMCINOLONE ACETONIDE 10 MG/ML IJ SUSP
10.0000 mg | Freq: Once | INTRAMUSCULAR | Status: AC
  Administered 2015-06-21: 10 mg

## 2015-06-23 NOTE — Progress Notes (Signed)
Subjective:     Patient ID: Sabrina Welch, female   DOB: [birth year], 45 y.o.   MRN: [id number]  HPI patient states that from surgery I seem okay but I'm getting pain in the bottom of my heel   Review of Systems     Objective:   Physical Exam Neurovascular status intact with no discomfort noted on the medial side of the heel but I noted on the lateral side there is inflammation and pain within the band plantar    Assessment:     Acute lateral band fasciitis left    Plan:     Reviewed x-ray and injected the lateral band 3 mg Kenalog 5 mg Xylocaine and instructed on physical therapy and reappoint to recheck  X-ray report indicates that there is no depression of the arch small spur and no sign of stress fracture

## 2015-06-29 ENCOUNTER — Encounter: Payer: Self-pay | Admitting: Internal Medicine

## 2015-06-29 ENCOUNTER — Ambulatory Visit (INDEPENDENT_AMBULATORY_CARE_PROVIDER_SITE_OTHER): Payer: BLUE CROSS/BLUE SHIELD | Admitting: Internal Medicine

## 2015-06-29 ENCOUNTER — Telehealth: Payer: Self-pay | Admitting: Gastroenterology

## 2015-06-29 ENCOUNTER — Ambulatory Visit: Payer: BLUE CROSS/BLUE SHIELD | Admitting: Internal Medicine

## 2015-06-29 VITALS — BP 122/84 | HR 101 | Ht 69.0 in | Wt 238.0 lb

## 2015-06-29 DIAGNOSIS — J45991 Cough variant asthma: Secondary | ICD-10-CM | POA: Diagnosis not present

## 2015-06-29 DIAGNOSIS — J310 Chronic rhinitis: Secondary | ICD-10-CM

## 2015-06-29 MED ORDER — ESOMEPRAZOLE MAGNESIUM 40 MG PO CPDR: 40.0000 mg | DELAYED_RELEASE_CAPSULE | Freq: Two times a day (BID) | ORAL | Status: DC

## 2015-06-29 MED ORDER — AMOXICILLIN-POT CLAVULANATE 875-125 MG PO TABS
1.0000 | ORAL_TABLET | Freq: Two times a day (BID) | ORAL | Status: DC
Start: 2015-06-29 — End: 2015-07-12

## 2015-06-29 NOTE — Telephone Encounter (Signed)
Spoke with patient and she meant Nexium. Refilled Nexium.

## 2015-06-29 NOTE — Patient Instructions (Addendum)
Take prednisone as  per podiatrist  Augmentin 875 mg take one pill twice daily  X 10 days - take at breakfast and supper with large glass of water.  It would help reduce the usual side effects (diarrhea and yeast infections) if you ate cultured yogurt at lunch.   Start dysmista one twice daily after you finish your antibiotics and just use nasal saline for now   Please schedule a follow up visit in 3 months but call sooner if needed to consider lowering dose of dulera

## 2015-06-29 NOTE — Progress Notes (Signed)
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 Pulmonary Consult    Self referral. Pt c/o cough, SOB, and wheezing for the past 6 days. Her cough is prod with moderate yellow sputum.    worse at hs  Transiently better in past with abx including levaquin/augmentin Mostly sob with cough some better with proair  rec Dulera 100 Take 2 puffs first thing in am and then another 2 puffs about 12 hours later.  sinus CT  Change nexium 40 mg Take 30- 60 min before your first and last meals of the day  GERD  Diet  Prevnar today Late add sinus CT POS  5/13 > augmentin x 21 d and Prednisone 10 mg take  4 each am x 2 days,   2 each am x 2 days,  1 each am x 2 days and stop    10/13/2013 f/u  Pulmonary office visit/ Sabrina Welch on dulera 100 2bid Chief Complaint  Patient presents with  . Cough    PFT done today-- cough has improved since last OV- Still has some sob with exertion, and cough non-productive but imoproved  Not really clear whether it's resting or saba that helps her doe more but only occurs with inclines/ steps rec Increase dulera 200 Take 2 puffs first thing in am and then another 2 puffs about 12 hours later.  Only use your albuterol (proair) as a rescue medication  Weight control    04/22/2014 f/u ov/Sabrina Welch re: asthma flare since 04/09/14  Chief Complaint  Patient presents with  . Acute Visit     Pt c/o increased SOB and wheezing- was seen by UC on 04/07/14 and was given neb tx, kenalog inj, abx and pred taper. Her symptoms improved but symptoms started back x 2 days ago.    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 Follow-up    Pt states that her breathing was better, then worse, then better again for the past wk. She is using her proair 1-2 x per day. She still c/o nasal congestion and has non prod cough.  finishing up 20 days of augmentin   Last saba 4 h prior to OV  And starting to "feel tight" again, says can hear her wheezing across the room when it's bad  rec Add singulair 10 mg each evening to your current meds Please see patient coordinator before you leave today  to schedule sinus ct for next week, not sooner Prednisone 10 mg take  4 each am x 2 days,   2 each am x 2 days,  1 each am x 2 days and stop    07/03/2014 f/u ov/Sabrina Welch Sabrina Welch   Chief Complaint  Patient presents with  . Follow-up    Pt states that her breathing is some better since the last visit. She has started coughing up minimal yellow/brown x 2 days. She is using albuterol inhaler 1-2 x per wk on average.   lately every time walk across office get sob/ not sure albuterol helps this problem Also hs cough persists  rec dymista one twice daily until you use up the sample and fill the rx if helps you breath through you nose and  if not we need to refer you to an ENT doctor For drainage take chlortrimeton (chlorpheniramine) 4 mg every 4 hours available over the counter (may cause drowsiness)      07/08/2014 acute ov/Sabrina Welch re: sob/ cough/ wheeze Chief Complaint  Patient presents with  . Acute Visit    Pt c/o increased wheezing x 2 days- has used rescue inhaler x 2 today with minimal relief. She is also c/o more SOB than usual.  Her cough has been prod with brown sputum for the past 2 days.   Misunderstood and stopped dulera p last ov and gradually worse since then  rec Augmentin 875 mg take one pill twice daily  X 10 days - take at breakfast and supper with large glass of water.  It would help reduce the usual side effects (diarrhea and yeast infections) if you ate cultured yogurt at lunch.  Prednisone 10 mg take  4 each am x 2 days,   2 each am x 2 days,  1 each am x 2 days and stop  Resume dulera 200 Take 2 puffs first thing in am and then another 2 puffs about 12 hours later.    08/14/2014 f/u ov/Sabrina Welch re: cough variant asthma Chief Complaint  Patient presents with  . Follow-up    Pt states breathing is back to her normal baseline and cough has resolved. No new co's today.  on dulera 200 2bid / singulair no need for saba or cough meds/ still on max gerd rx  rec Try off singulair when you finish it No other changes   06/29/2015  f/u ov/Sabrina Welch re: asthma/ chronic rhinitis on dulera 200 2bid and flonase/astelin instead of dymista  Chief Complaint  Patient presents with  . Follow-up    Breathing is overall doing well. She c/o nasal congestion.  She has not had to use proair in the past 2 wks.   increased yellow drainage x 2 weeks  But breathing fine and no need for saba at all   No obvious day to day or daytime variabilty or assoc  cp or chest tightness, subjective wheeze or overt   hb symptoms. No unusual exp hx or h/o childhood pna/ asthma or knowledge of premature birth.  Sleeping ok without nocturnal  or early  am exacerbation  of respiratory  c/o's or need for noct saba. Also denies any obvious fluctuation  of symptoms with weather or environmental changes or other aggravating or alleviating factors except as outlined above   Current Medications, Allergies, Complete Past Medical History, Past Surgical History, Family History, and Social History were reviewed in [employer] record.  ROS  The following are not active complaints unless bolded sore throat, dysphagia, dental problems, itching, sneezing,  nasal congestion   or excess/ purulent secretions, ear ache,   fever, chills, sweats, unintended wt loss, pleuritic or exertional cp, hemoptysis,  orthopnea pnd or leg swelling, presyncope, palpitations, heartburn, abdominal pain, anorexia, nausea, vomiting, diarrhea  or change in bowel or urinary habits, change in stools or urine, dysuria,hematuria,  rash, arthralgias, visual complaints, headache, numbness weakness or ataxia or problems with walking or coordination,  change in mood/affect or memory.                 Objective:   Physical Exam  amb wf nad  10/13/2013        209  >  04/22/2014  251 > 06/04/2014  250 >  07/03/2014   253  > 07/08/2014  257 > 08/14/14 260 > 06/30/2015  238     09/09/13 201 lb (91.173 kg)  05/16/13 187 lb (84.823 kg)  09/15/11 202 lb (91.627 kg)      HEENT: nl dentition, mucopurulent secretions bilaterally assoc with  Turbinate  severe erythema/swelling , and nl  orophanx. Nl external ear canals without cough reflex   NECK :  without JVD/Nodes/TM/ nl carotid upstrokes bilaterally   LUNGS: no acc muscle use,   Clear bilaterally to A and P    CV:  RRR  no s3 or murmur or increase in P2, no edema   ABD:  soft and nontender with nl excursion in the supine position. No bruits or organomegaly, bowel sounds nl  MS:  warm without deformities, calf tenderness, cyanosis or clubbing  SKIN: warm and dry without lesions             Assessment & Plan:

## 2015-06-30 ENCOUNTER — Encounter: Payer: Self-pay | Admitting: Internal Medicine

## 2015-06-30 DIAGNOSIS — J310 Chronic rhinitis: Secondary | ICD-10-CM | POA: Insufficient documentation

## 2015-06-30 NOTE — Assessment & Plan Note (Signed)
Flare 06/29/2015 > rx augmentin x 10 d > sinus ct if not improved

## 2015-06-30 NOTE — Assessment & Plan Note (Addendum)
-  trial of dulera 100 Take 2 bid 09/09/13 > ? Still needing saba with activity but nl pft's p taking saba am 10/13/2013 >  dulera 200 2 bid trial  - flared on dulera 200 with mostly upper airway symptoms > rechallenge with dulera 100 2bid > preferred the 200 dose - allergy profile 06/04/2014 > IgE 10, neg RAST - singulair trial 06/04/2014 > D/c 08/2014   - spirometry off dulera 07/08/14 =  FEV1 down from 4.04 to 2.22(65%) with ratio 67 and clasisic obst f/v curve    - 06/29/2015  extensive coaching HFA effectiveness =    90%    Despite active rhinitis/sinusitis All goals of chronic asthma control met including optimal function and elimination of symptoms with minimal need for rescue therapy.  Contingencies discussed in full including contacting this office immediately if not controlling the symptoms using the rule of two's - may be able to consider step down rx next ov    I had an extended discussion with the patient reviewing all relevant studies completed to date and  lasting 15 to 20 minutes of a 25 minute visit    Each maintenance medication was reviewed in detail including most importantly the difference between maintenance and prns and under what circumstances the prns are to be triggered using an action plan format that is not reflected in the computer generated alphabetically organized AVS.    Please see instructions for details which were reviewed in writing and the patient given a copy highlighting the part that I personally wrote and discussed at today's ov.

## 2015-07-12 ENCOUNTER — Encounter: Payer: Self-pay | Admitting: Podiatry

## 2015-07-12 ENCOUNTER — Ambulatory Visit (INDEPENDENT_AMBULATORY_CARE_PROVIDER_SITE_OTHER): Payer: BLUE CROSS/BLUE SHIELD | Admitting: Podiatry

## 2015-07-12 VITALS — BP 111/86 | HR 69 | Resp 16

## 2015-07-12 DIAGNOSIS — M722 Plantar fascial fibromatosis: Secondary | ICD-10-CM

## 2015-07-12 MED ORDER — HYDROCODONE-ACETAMINOPHEN 10-325 MG PO TABS: 1.0000 | ORAL_TABLET | Freq: Three times a day (TID) | ORAL | Status: DC | PRN

## 2015-07-12 NOTE — Progress Notes (Signed)
Subjective:     Patient ID: Sabrina Welch, female   DOB: Jun 19, 1970, 45 y.o.   MRN: [id number]  HPI patient states I'm quite a bit improved but I'm still having some discomfort on the bottom outside of my left heel. It seems to gradually be getting some better but at times it still really throbs especially on weekends after I been on it all week   Review of Systems     Objective:   Physical Exam Neurovascular status intact muscle strength adequate with continued discomfort in the plantar aspect left heel lateral side but improved from previous    Assessment:     Doing well post endoscopic surgery left medial heel with discomfort in the plantar lateral heel    Plan:     Reviewed anti-inflammatories and did write a prescription for Vicodin 02/01/2024 for nighttime pain relief and advised on continued physical therapy immobilization and reappoint in the next several months or earlier if any issues should occur

## 2015-08-16 ENCOUNTER — Encounter: Payer: Self-pay | Admitting: Gastroenterology

## 2015-08-16 ENCOUNTER — Ambulatory Visit (INDEPENDENT_AMBULATORY_CARE_PROVIDER_SITE_OTHER): Payer: BLUE CROSS/BLUE SHIELD | Admitting: Gastroenterology

## 2015-08-16 ENCOUNTER — Other Ambulatory Visit (INDEPENDENT_AMBULATORY_CARE_PROVIDER_SITE_OTHER): Payer: BLUE CROSS/BLUE SHIELD

## 2015-08-16 VITALS — BP 102/76 | HR 96 | Ht 68.75 in | Wt 227.1 lb

## 2015-08-16 DIAGNOSIS — K589 Irritable bowel syndrome without diarrhea: Secondary | ICD-10-CM | POA: Diagnosis not present

## 2015-08-16 DIAGNOSIS — D509 Iron deficiency anemia, unspecified: Secondary | ICD-10-CM

## 2015-08-16 DIAGNOSIS — K219 Gastro-esophageal reflux disease without esophagitis: Secondary | ICD-10-CM | POA: Diagnosis not present

## 2015-08-16 LAB — BASIC METABOLIC PANEL
BUN: 16 mg/dL (ref 6–23)
CALCIUM: 9.7 mg/dL (ref 8.4–10.5)
CO2: 27 meq/L (ref 19–32)
CREATININE: 1.01 mg/dL (ref 0.40–1.20)
Chloride: 103 mEq/L (ref 96–112)
GFR: 63.44 mL/min (ref >60.00)
GLUCOSE: 97 mg/dL (ref 70–99)
Potassium: 3.8 mEq/L (ref 3.5–5.1)
SODIUM: 140 meq/L (ref 135–145)

## 2015-08-16 MED ORDER — PROMETHAZINE HCL 25 MG PO TABS: ORAL_TABLET | ORAL | Status: DC

## 2015-08-16 MED ORDER — DICYCLOMINE HCL 10 MG PO CAPS: 10.0000 mg | ORAL_CAPSULE | Freq: Two times a day (BID) | ORAL | Status: DC | PRN

## 2015-08-16 MED ORDER — ESOMEPRAZOLE MAGNESIUM 40 MG PO CPDR: 40.0000 mg | DELAYED_RELEASE_CAPSULE | Freq: Two times a day (BID) | ORAL | Status: DC

## 2015-08-16 NOTE — Patient Instructions (Addendum)
Your physician has requested that you go to the basement for the following lab work before leaving today:Bmet.   We have sent the following prescriptions to your mail in pharmacy:Nexium, Bentyl and promethazine.   If you have not heard from your mail in pharmacy within 1 week or if you have not received your medication in the mail, please contact us at [phone] so we may find out why.  We will contact you in 6 months to have you come in for repeat lab work.   You have been given a Low fodmap diet.
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
## 2015-08-17 ENCOUNTER — Other Ambulatory Visit: Payer: Self-pay | Admitting: *Deleted

## 2015-08-17 DIAGNOSIS — R799 Abnormal finding of blood chemistry, unspecified: Secondary | ICD-10-CM

## 2015-08-26 ENCOUNTER — Ambulatory Visit: Payer: BLUE CROSS/BLUE SHIELD | Admitting: Gastroenterology

## 2015-09-13 ENCOUNTER — Ambulatory Visit (INDEPENDENT_AMBULATORY_CARE_PROVIDER_SITE_OTHER): Payer: BLUE CROSS/BLUE SHIELD | Admitting: Podiatry

## 2015-09-13 ENCOUNTER — Encounter: Payer: Self-pay | Admitting: Podiatry

## 2015-09-13 DIAGNOSIS — M722 Plantar fascial fibromatosis: Secondary | ICD-10-CM | POA: Diagnosis not present

## 2015-09-13 MED ORDER — HYDROCODONE-ACETAMINOPHEN 10-325 MG PO TABS: 1.0000 | ORAL_TABLET | Freq: Three times a day (TID) | ORAL | Status: DC | PRN

## 2015-09-13 NOTE — Progress Notes (Signed)
Subjective:     Patient ID: Sabrina Welch, female   DOB: [birth year], 45 y.o.   MRN: [id number]  HPI patient states I'm getting a lot of pain in the outside and central part of my left heel. It doesn't hurt where it started but this area is simply not getting better and the injection didn't help and I been trying to wear her boot and trying to use my splint at night   Review of Systems     Objective:   Physical Exam Neurovascular status intact muscle strength adequate with exquisite discomfort in the center and lateral portion of the plantar fascial left with inflammation and fluid buildup around this part of the tendon. The incision sites themselves of healed well and the medial plantar fascial is doing very well with no pain    Assessment:     Doing well from medial band release of the plantar fascia with quite a bit of pain central lateral band    Plan:     I discussed this condition with patient at great length. Due to long-standing nature and the inflammation and pain in the center and lateral portion of the tendon I have recommended release of the center and lateral fascia. I explained procedure and risk associated with this and patient wants this done and I absolutely explaining there is no guarantee this will solve the problem. Patient stands is completely and is scheduled for outpatient surgery to reduce and release the center and lateral band and signs consent form. I did give her a new air fracture walker so she has a clean boot for the postoperative period

## 2015-09-13 NOTE — Patient Instructions (Signed)
Pre-Operative Instructions  Congratulations, you have decided to take an important step to improving your quality of life.  You can be assured that the doctors of [facility] will be with you every step of the way.  1. Plan to be at the surgery center/hospital at least 1 (one) hour prior to your scheduled time unless otherwise directed by the surgical center/hospital staff.  You must have a responsible adult accompany you, remain during the surgery and drive you home.  Make sure you have directions to the surgical center/hospital and know how to get there on time. 2. For hospital based surgery you will need to obtain a history and physical form from your family physician within 1 month prior to the date of surgery- we will give you a form for you primary physician.  3. We make every effort to accommodate the date you request for surgery.  There are however, times where surgery dates or times have to be moved.  We will contact you as soon as possible if a change in schedule is required.   4. No Aspirin/Ibuprofen for one week before surgery.  If you are on aspirin, any non-steroidal anti-inflammatory medications (Mobic, Aleve, Ibuprofen) you should stop taking it 7 days prior to your surgery.  You make take Tylenol  For pain prior to surgery.  5. Medications- If you are taking daily heart and blood pressure medications, seizure, reflux, allergy, asthma, anxiety, pain or diabetes medications, make sure the surgery center/hospital is aware before the day of surgery so they may notify you which medications to take or avoid the day of surgery. 6. No food or drink after midnight the night before surgery unless directed otherwise by surgical center/hospital staff. 7. No alcoholic beverages 24 hours prior to surgery.  No smoking 24 hours prior to or 24 hours after surgery. 8. Wear loose pants or shorts- loose enough to fit over bandages, boots, and casts. 9. No slip on shoes, sneakers are best. 10. Bring  your boot with you to the surgery center/hospital.  Also bring crutches or a walker if your physician has prescribed it for you.  If you do not have this equipment, it will be provided for you after surgery. 11. If you have not been contracted by the surgery center/hospital by the day before your surgery, call to confirm the date and time of your surgery. 12. Leave-time from work may vary depending on the type of surgery you have.  Appropriate arrangements should be made prior to surgery with your employer. 13. Prescriptions will be provided immediately following surgery by your doctor.  Have these filled as soon as possible after surgery and take the medication as directed. 14. Remove nail polish on the operative foot. 15. Wash the night before surgery.  The night before surgery wash the foot and leg well with the antibacterial soap provided and water paying special attention to beneath the toenails and in between the toes.  Rinse thoroughly with water and dry well with a towel.  Perform this wash unless told not to do so by your physician.  Enclosed: 1 Ice pack (please put in freezer the night before surgery)   1 Hibiclens skin cleaner   Pre-op Instructions  If you have any questions regarding the instructions, do not hesitate to call our office.  [place]: [address] [place], [place] [address] [phone]  [place]: [address] [place], [place] [address] [phone]  [place]: [address]  [place], [place] [address] [phone]   Dr.   Neziah Braley DPM, Dr. Matthew Wagoner DPM, Dr. M. Todd Hyatt DPM, Dr. Titorya Stover DPM 

## 2015-09-14 ENCOUNTER — Other Ambulatory Visit (INDEPENDENT_AMBULATORY_CARE_PROVIDER_SITE_OTHER): Payer: BLUE CROSS/BLUE SHIELD

## 2015-09-14 DIAGNOSIS — R799 Abnormal finding of blood chemistry, unspecified: Secondary | ICD-10-CM

## 2015-09-14 LAB — BASIC METABOLIC PANEL
BUN: 14 mg/dL (ref 6–23)
CHLORIDE: 103 meq/L (ref 96–112)
CO2: 29 meq/L (ref 19–32)
CREATININE: 0.88 mg/dL (ref 0.40–1.20)
Calcium: 9.2 mg/dL (ref 8.4–10.5)
GFR: 74.35 mL/min (ref >60.00)
GLUCOSE: 93 mg/dL (ref 70–99)
POTASSIUM: 4 meq/L (ref 3.5–5.1)
Sodium: 137 mEq/L (ref 135–145)

## 2015-09-14 LAB — URINALYSIS, ROUTINE W REFLEX MICROSCOPIC
Bilirubin Urine: NEGATIVE
Ketones, ur: NEGATIVE
Leukocytes, UA: NEGATIVE
NITRITE: NEGATIVE
SPECIFIC GRAVITY, URINE: 1.02 (ref 1.000–1.030)
Total Protein, Urine: NEGATIVE
URINE GLUCOSE: NEGATIVE
Urobilinogen, UA: 0.2 (ref 0.0–1.0)
pH: 6 (ref 5.0–8.0)

## 2015-09-16 DIAGNOSIS — M722 Plantar fascial fibromatosis: Secondary | ICD-10-CM

## 2015-10-04 ENCOUNTER — Ambulatory Visit (INDEPENDENT_AMBULATORY_CARE_PROVIDER_SITE_OTHER): Payer: BLUE CROSS/BLUE SHIELD | Admitting: Internal Medicine

## 2015-10-04 ENCOUNTER — Telehealth: Payer: Self-pay | Admitting: Internal Medicine

## 2015-10-04 ENCOUNTER — Encounter: Payer: Self-pay | Admitting: Internal Medicine

## 2015-10-04 VITALS — BP 112/80 | HR 102 | Ht 69.0 in | Wt 220.0 lb

## 2015-10-04 DIAGNOSIS — J45991 Cough variant asthma: Secondary | ICD-10-CM

## 2015-10-04 DIAGNOSIS — J310 Chronic rhinitis: Secondary | ICD-10-CM | POA: Diagnosis not present

## 2015-10-04 MED ORDER — AMOXICILLIN-POT CLAVULANATE 875-125 MG PO TABS: 1.0000 | ORAL_TABLET | Freq: Two times a day (BID) | ORAL | Status: DC

## 2015-10-04 MED ORDER — FLUCONAZOLE 100 MG PO TABS: 100.0000 mg | ORAL_TABLET | Freq: Every day | ORAL | Status: DC

## 2015-10-04 NOTE — Assessment & Plan Note (Signed)
-  trial of dulera 100 Take 2 bid 09/09/13 > ? Still needing saba with activity but nl pft's p taking saba am 10/13/2013 >  dulera 200 2 bid trial  - flared on dulera 200 with mostly upper airway symptoms > rechallenge with dulera 100 2bid > preferred the 200 dose - allergy profile 06/04/2014 > IgE 10, neg RAST - singulair trial 06/04/2014 > D/c 08/2014   - spirometry off dulera 07/08/14 =  FEV1 down from 4.04 to 2.22(65%) with ratio 67 and clasisic obst f/v curve  - 06/29/2015  extensive coaching HFA effectiveness =    90%  - Spirometry 10/04/2015  Nl including fef 2575   All goals of chronic asthma control met including optimal function and elimination of symptoms with minimal need for rescue therapy.  Contingencies discussed in full including contacting this office immediately if not controlling the symptoms using the rule of two's.     I had an extended discussion with the patient reviewing all relevant studies completed to date and  lasting 15 to 20 minutes of a 25 minute visit    Each maintenance medication was reviewed in detail including most importantly the difference between maintenance and prns and under what circumstances the prns are to be triggered using an action plan format that is not reflected in the computer generated alphabetically organized AVS.    Please see instructions for details which were reviewed in writing and the patient given a copy highlighting the part that I personally wrote and discussed at today's ov.     

## 2015-10-04 NOTE — Patient Instructions (Signed)
Augmentin 875 mg take one pill twice daily  X 20 days - take at breakfast and supper with large glass of water.  It would help reduce the usual side effects (diarrhea and yeast infections) if you ate cultured yogurt at lunch.   If not 100% better after antibiotics or nose symptoms start back up, call for sinus Ct and referral to ENT as next step  Please schedule a follow up visit in 3 months but call sooner if needed

## 2015-10-04 NOTE — Telephone Encounter (Signed)
Diflucan 100 mg qd x 3 prn

## 2015-10-04 NOTE — Assessment & Plan Note (Signed)
Flare 06/29/2015 > rx augmentin x 10 d> helped x sev weeks only - 10/04/2015 rec augmentin x 20 days then eval by repeat sinus ct at end of rx if not 100%  And ent eval

## 2015-10-04 NOTE — Telephone Encounter (Signed)
Dr Sherene SiresWert, pt is requesting Diflucan since you put her on augmentin  Please advise if this is okay, thanks

## 2015-10-04 NOTE — Telephone Encounter (Signed)
Pt is aware via MyChart message that this prescription will be sent in. Nothing further was needed.

## 2015-10-04 NOTE — Progress Notes (Signed)
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 Pulmonary Consult    Self referral. Pt c/o cough, SOB, and wheezing for the past 6 days. Her cough is prod with moderate yellow sputum.    worse at hs  Transiently better in past with abx including levaquin/augmentin Mostly sob with cough some better with proair  rec Dulera 100 Take 2 puffs first thing in am and then another 2 puffs about 12 hours later.  sinus CT  Change nexium 40 mg Take 30- 60 min before your first and last meals of the day  GERD  Diet  Prevnar today Late add sinus CT POS  5/13 > augmentin x 21 d and Prednisone 10 mg take  4 each am x 2 days,   2 each am x 2 days,  1 each am x 2 days and stop    10/13/2013 f/u  Pulmonary office visit/ Sabrina Welch on dulera 100 2bid Chief Complaint  Patient presents with  . Cough    PFT done today-- cough has improved since last OV- Still has some sob with exertion, and cough non-productive but imoproved  Not really clear whether it's resting or saba that helps her doe more but only occurs with inclines/ steps rec Increase dulera 200 Take 2 puffs first thing in am and then another 2 puffs about 12 hours later.  Only use your albuterol (proair) as a rescue medication  Weight control    04/22/2014 f/u ov/Sabrina Welch re: asthma flare since 04/09/14  Chief Complaint  Patient presents with  . Acute Visit   Pt c/o increased SOB and wheezing- was seen by UC on 04/07/14 and was given neb tx, kenalog inj, abx and pred taper. Her symptoms improved but symptoms started back x 2 days ago.    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 Follow-up    Pt states that her breathing was better, then worse, then better again for the past wk. She is using her proair 1-2 x per day. She still c/o nasal congestion and has non prod cough.   finishing up  20 days of augmentin   Last saba 4 h prior to OV  And starting to "feel tight" again, says can hear her wheezing across the room when it's bad  rec Add singulair 10 mg each evening to your current meds Please see patient coordinator before you leave today  to schedule sinus ct for next week, not sooner Prednisone 10 mg take  4 each am x 2 days,   2 each am x 2 days,  1 each am x 2 days and stop    07/03/2014 f/u ov/Sabrina Welch NW:GNFAOZ vs uacs   Chief Complaint  Patient presents with  . Follow-up    Pt states that her breathing is some better since the last visit. She has started coughing up minimal yellow/brown x 2 days. She is using albuterol inhaler 1-2 x per wk on average.   lately every time walk across office get sob/ not sure albuterol helps this problem Also hs cough persists  rec dymista one twice daily until you use up the sample and fill the rx if helps you breath through you nose and  if not we need to refer you to an ENT doctor For drainage take chlortrimeton (chlorpheniramine) 4 mg every 4 hours available over the counter (may cause drowsiness)      07/08/2014 acute ov/Sabrina Welch re: sob/ cough/ wheeze Chief Complaint  Patient presents with  . Acute Visit    Pt c/o increased wheezing x 2 days- has used rescue inhaler x 2 today with minimal relief. She is also c/o more SOB than usual.  Her cough has been prod with brown sputum for the past 2 days.   Misunderstood and stopped dulera p last ov and gradually worse since then  rec Augmentin 875 mg take one pill twice daily  X 10 days - take at breakfast and supper with large glass of water.  It would help reduce the usual side effects (diarrhea and yeast infections) if you ate cultured yogurt at lunch.  Prednisone 10 mg take  4 each am x 2 days,   2 each am x 2 days,  1 each am x 2 days and stop  Resume dulera 200 Take 2 puffs first thing in am and then another 2 puffs about 12 hours later.    08/14/2014 f/u ov/Sabrina Welch re: cough variant asthma Chief Complaint  Patient presents with  . Follow-up    Pt states breathing is back to her normal baseline and cough has resolved. No new co's today.  on dulera 200 2bid / singulair no need for saba or cough meds/ still on max gerd rx  rec Try off singulair when you finish it No other changes   06/29/2015  f/u ov/Sabrina Welch re: asthma/ chronic rhinitis on dulera 200 2bid and flonase/astelin instead of dymista  Chief Complaint  Patient presents with  . Follow-up    Breathing is overall doing well. She c/o nasal congestion.  She has not had to use proair in the past 2 wks.   increased yellow drainage x 2 weeks  But breathing fine and no need for saba at all  rec Take prednisone as  per podiatrist  Augmentin 875 mg take one pill twice daily  X 10 days - take at breakfast and supper with large glass of water.  It would help reduce the usual side effects (diarrhea and yeast infections) if you ate  cultured yogurt at lunch.   Start dysmista one twice daily after you finish your antibiotics and just  use nasal saline for now    10/04/2015  f/u ov/Sabrina Welch re: cough variant asthma/chronic rhinitis  Chief Complaint  Patient presents with  . Follow-up    Pt states breathing is overall doing well. She c/o sinus congestion and minimal non prod cough.  She is using proair 2 x per wk on average.    worse since late April  Sleeping propped up due to breathing but never tries the noct saba  Planning foot surgery this week   No obvious day to day or daytime variabilty or assoc  cp or chest tightness, subjective wheeze or overt   hb symptoms. No unusual exp hx or h/o childhood pna/ asthma or knowledge of premature birth.  Sleeping ok without nocturnal  or early am exacerbation  of respiratory  c/o's or need for noct saba. Also denies any obvious fluctuation of symptoms with weather or environmental changes or other aggravating or alleviating factors except as outlined above   Current Medications, Allergies, Complete Past Medical History, Past Surgical History, Family History, and Social History were reviewed in [employer] record.  ROS  The following are not active complaints unless bolded sore throat, dysphagia, dental problems, itching, sneezing,  nasal congestion   or excess/ purulent secretions, ear ache,   fever, chills, sweats, unintended wt loss, pleuritic or exertional cp, hemoptysis,  orthopnea pnd or leg swelling, presyncope, palpitations, heartburn, abdominal pain, anorexia, nausea, vomiting, diarrhea  or change in bowel or urinary habits, change in stools or urine, dysuria,hematuria,  rash, arthralgias, visual complaints, headache, numbness weakness or ataxia or problems with walking or coordination,  change in mood/affect or memory.                 Objective:   Physical Exam  amb wf nad  10/13/2013        209  >  04/22/2014  251 > 06/04/2014  250 >  07/03/2014   253   > 07/08/2014  257 > 08/14/14 260 > 06/30/2015  238 > 10/04/2015 220     09/09/13 201 lb (91.173 kg)  05/16/13 187 lb (84.823 kg)  09/15/11 202 lb (91.627 kg)      HEENT: nl dentition, mucopurulent secretions bilaterally assoc with  Turbinate  Mod severe erythema/swelling , and nl  orophanx. Nl external ear canals without cough reflex   NECK :  without JVD/Nodes/TM/ nl carotid upstrokes bilaterally   LUNGS: no acc muscle use,   Clear bilaterally to A and P    CV:  RRR  no s3 or murmur or increase in P2, no edema   ABD:  soft and nontender with nl excursion in the supine position. No bruits or organomegaly, bowel sounds nl  MS:  warm without deformities, calf tenderness, cyanosis or clubbing  SKIN: warm and dry without lesions             Assessment & Plan:

## 2015-10-05 ENCOUNTER — Encounter: Payer: Self-pay | Admitting: Podiatry

## 2015-10-05 DIAGNOSIS — M722 Plantar fascial fibromatosis: Secondary | ICD-10-CM | POA: Diagnosis not present

## 2015-10-06 ENCOUNTER — Telehealth: Payer: Self-pay | Admitting: *Deleted

## 2015-10-06 NOTE — Telephone Encounter (Addendum)
Post op courtesy call-Pt states yesterday was kinda rough, but today is much better.  Pt states the Surgical Center called and they took the ace off and there was a little blood but it was dry. I told pt that often happened when the skin is cut it will ooze a little blood and then stop, but to report to our office if started again.  I instructed pt not to dangle or weight bear more than 15 mins/hour, leave the dressing and boot in place, continue to elevate and ice,take the pain medication and call with concerns. Pt states understanding.10/28/2015-Pt states she has been back at work a week and her foot really hurts and she would like a refill of the Vicodin.  Dr. Charlsie Merlesegal states refill once as previously.  Left message for pt to pick the [facility] and to make an earlier appt if continuing to have problems.

## 2015-10-15 ENCOUNTER — Ambulatory Visit (INDEPENDENT_AMBULATORY_CARE_PROVIDER_SITE_OTHER): Payer: BLUE CROSS/BLUE SHIELD | Admitting: Podiatry

## 2015-10-15 VITALS — Temp 98.8°F

## 2015-10-15 DIAGNOSIS — M722 Plantar fascial fibromatosis: Secondary | ICD-10-CM

## 2015-10-15 DIAGNOSIS — Z9889 Other specified postprocedural states: Secondary | ICD-10-CM

## 2015-10-15 MED ORDER — HYDROCODONE-ACETAMINOPHEN 10-325 MG PO TABS: 1.0000 | ORAL_TABLET | Freq: Three times a day (TID) | ORAL | Status: DC | PRN

## 2015-10-15 NOTE — Progress Notes (Signed)
Subjective:     Patient ID: Sabrina Welch, female   DOB: November 21, 1970, 45 y.o.   MRN: [id number]  HPI this patient presents the office following surgery on her left heel.  An endoscopic plantar fasciotomy was performed on her central and lateral band.  She presents the office today wearing the bandage applied at the surgical center. She says she has been in pain, but she has been wearing her cam walker as directed. She says there has been painful, which she has taken medication that was prescribed. She presents the office today for her first postoperative visit   Review of Systems     Objective:   Physical Exam neurovascular status intact as prior to surgery. Examination of her left foot does reveal good wound coaptation and sutures are intact at the medial and lateral aspect of the right heel. No signs of redness, swelling, infection or drainage noted. There is continued swelling noted at the insertion of the plantar fascia of the left foot     Assessment:     S/p foot surgery.     Plan:     ROV  This Patient has been experiencing pain out of proportion since her [facility] . The foot was re-bandage at this visit. She was given a prescription for additional hydrocodone for which she is to take as needed. She was told to return to the office in 10 days for an evaluation by Dr. Charlsie Merlesegal and the removal of the sutures at that time.     Helane GuntherGregory Deeanne Deininger DPM

## 2015-10-22 ENCOUNTER — Encounter: Payer: Self-pay | Admitting: Podiatry

## 2015-10-22 ENCOUNTER — Ambulatory Visit (INDEPENDENT_AMBULATORY_CARE_PROVIDER_SITE_OTHER): Payer: BLUE CROSS/BLUE SHIELD | Admitting: Podiatry

## 2015-10-22 DIAGNOSIS — M722 Plantar fascial fibromatosis: Secondary | ICD-10-CM

## 2015-10-24 NOTE — Progress Notes (Signed)
Subjective:     Patient ID: Sabrina Welch, female   DOB: 17-Aug-1970, 45 y.o.   MRN: [id number]  HPI patient states it seems like it's doing real well and it seems like it's released the pain that I was experiencing   Review of Systems     Objective:   Physical Exam Neurovascular status intact muscle strength adequate with a significant diminishment of discomfort in the plantar heel left with wound edges well coapted    Assessment:     Doing well post release of the entire fascia left heel    Plan:     Reviewed continued immobilization boot usage and reapplied sterile dressing and reappoint 2 weeks for suture removal or earlier if any issues should occur

## 2015-10-28 MED ORDER — HYDROCODONE-ACETAMINOPHEN 10-325 MG PO TABS: 1.0000 | ORAL_TABLET | Freq: Three times a day (TID) | ORAL | Status: DC | PRN

## 2015-11-24 ENCOUNTER — Ambulatory Visit (INDEPENDENT_AMBULATORY_CARE_PROVIDER_SITE_OTHER): Payer: BLUE CROSS/BLUE SHIELD | Admitting: Podiatry

## 2015-11-24 DIAGNOSIS — M722 Plantar fascial fibromatosis: Secondary | ICD-10-CM

## 2015-11-24 DIAGNOSIS — Z9889 Other specified postprocedural states: Secondary | ICD-10-CM

## 2015-11-24 MED ORDER — HYDROCODONE-ACETAMINOPHEN 10-325 MG PO TABS: 1.0000 | ORAL_TABLET | Freq: Three times a day (TID) | ORAL | 0 refills | Status: DC | PRN

## 2015-11-25 NOTE — Progress Notes (Signed)
Subjective:     Patient ID: Sabrina Welch, female   DOB: Sep 26, 1970, 45 y.o.   MRN: [id number]  HPI patient presents stating that she is doing better but she still has some achiness in her foot and heel if she's on it too much   Review of Systems     Objective:   Physical Exam Neurovascular status intact muscle strength adequate negative Homans sign noted with well-healing surgical sites medial and lateral left heel with minimal discomfort when palpated and minimal edema noted with no pain also in the arch noted    Assessment:     Doing well after having released central lateral band left with hope that at this time we have resolve symptoms    Plan:     Advised on continued boot usage continued stretching exercises ice therapy and tramadol as needed. Patient will be rechecked again as needed and hopefully this is the end of the problem

## 2015-11-26 ENCOUNTER — Other Ambulatory Visit: Payer: BLUE CROSS/BLUE SHIELD

## 2016-01-04 NOTE — Progress Notes (Signed)
DOS 06.06.2017 Release of Central, Lat Band Left Heel

## 2016-01-07 ENCOUNTER — Encounter: Payer: Self-pay | Admitting: Internal Medicine

## 2016-01-07 ENCOUNTER — Ambulatory Visit (INDEPENDENT_AMBULATORY_CARE_PROVIDER_SITE_OTHER): Payer: BLUE CROSS/BLUE SHIELD | Admitting: Internal Medicine

## 2016-01-07 ENCOUNTER — Other Ambulatory Visit: Payer: Self-pay

## 2016-01-07 VITALS — BP 112/80 | HR 95 | Ht 69.0 in | Wt 222.0 lb

## 2016-01-07 DIAGNOSIS — J310 Chronic rhinitis: Secondary | ICD-10-CM | POA: Diagnosis not present

## 2016-01-07 DIAGNOSIS — J45991 Cough variant asthma: Secondary | ICD-10-CM

## 2016-01-07 MED ORDER — AZELASTINE HCL 0.1 % NA SOLN: 1.0000 | Freq: Two times a day (BID) | NASAL | 3 refills | Status: DC

## 2016-01-07 MED ORDER — MOMETASONE FURO-FORMOTEROL FUM 200-5 MCG/ACT IN AERO: INHALATION_SPRAY | RESPIRATORY_TRACT | 3 refills | Status: DC

## 2016-01-07 MED ORDER — FLUTICASONE PROPIONATE 50 MCG/ACT NA SUSP: 1.0000 | Freq: Two times a day (BID) | NASAL | 3 refills | Status: DC

## 2016-01-07 MED ORDER — ALBUTEROL SULFATE HFA 108 (90 BASE) MCG/ACT IN AERS: 2.0000 | INHALATION_SPRAY | Freq: Four times a day (QID) | RESPIRATORY_TRACT | 0 refills | Status: DC | PRN

## 2016-01-07 NOTE — Assessment & Plan Note (Signed)
-  trial of dulera 100 Take 2 bid 09/09/13 > ? Still needing saba with activity but nl pft's p taking saba am 10/13/2013 >  dulera 200 2 bid trial  - flared on dulera 200 with mostly upper airway symptoms > rechallenge with dulera 100 2bid > preferred the 200 dose - allergy profile 06/04/2014 > IgE 10, neg RAST - singulair trial 06/04/2014 > D/c 08/2014   - spirometry off dulera 07/08/14 =  FEV1 down from 4.04 to 2.22(65%) with ratio 67 and clasisic obst f/v curve  - 06/29/2015  extensive coaching HFA effectiveness =    90%  - Spirometry 10/04/2015  Nl including fef 2575   All goals of chronic asthma control met including optimal function and elimination of symptoms with minimal need for rescue therapy.  Contingencies discussed in full including contacting this office immediately if not controlling the symptoms using the rule of two's.     I had an extended discussion with the patient reviewing all relevant studies completed to date and  lasting 15 to 20 minutes of a 25 minute visit    Each maintenance medication was reviewed in detail including most importantly the difference between maintenance and prns and under what circumstances the prns are to be triggered using an action plan format that is not reflected in the computer generated alphabetically organized AVS.    Please see instructions for details which were reviewed in writing and the patient given a copy highlighting the part that I personally wrote and discussed at today's ov.

## 2016-01-07 NOTE — Assessment & Plan Note (Signed)
Flare 06/29/2015 > rx augmentin x 10 d> helped x sev weeks only - 10/04/2015 rec augmentin x 20 days   - Sinus CT 01/07/2016 >>>   Best rx = dysmista but not reimbursed so rec generic rx and ent eval  p check sinus ct first  As allergy w/u neg so far

## 2016-01-07 NOTE — Progress Notes (Signed)
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 Pulmonary Consult    Self referral. Pt c/o cough, SOB, and wheezing for the past 6 days. Her cough is prod with moderate yellow sputum.    worse at hs  Transiently better in past with abx including levaquin/augmentin Mostly sob with cough some better with proair  rec Dulera 100 Take 2 puffs first thing in am and then another 2 puffs about 12 hours later.  sinus CT  Change nexium 40 mg Take 30- 60 min before your first and last meals of the day  GERD  Diet  Prevnar today Late add sinus CT POS  5/13 > augmentin x 21 d and Prednisone 10 mg take  4 each am x 2 days,   2 each am x 2 days,  1 each am x 2 days and stop    10/13/2013 f/u  Pulmonary office visit/ Asahd Can on dulera 100 2bid Chief Complaint  Patient presents with  . Cough    PFT done today-- cough has improved since last OV- Still has some sob with exertion, and cough non-productive but imoproved  Not really clear whether it's resting or saba that helps her doe more but only occurs with inclines/ steps rec Increase dulera 200 Take 2 puffs first thing in am and then another 2 puffs about 12 hours later.  Only use your albuterol (proair) as a rescue medication  Weight control    04/22/2014 f/u ov/Shirly Bartosiewicz re: asthma flare since 04/09/14  Chief Complaint  Patient presents with  . Acute Visit   Pt c/o increased SOB and wheezing- was seen by UC on 04/07/14 and was given neb tx, kenalog inj, abx and pred taper. Her symptoms improved but symptoms started back x 2 days ago.    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 Follow-up    Pt states that her breathing was better, then worse, then better again for the past wk. She is using her proair 1-2 x per day. She still c/o nasal congestion and has non prod cough.   finishing up  20 days of augmentin   Last saba 4 h prior to OV  And starting to "feel tight" again, says can hear her wheezing across the room when it's bad  rec Add singulair 10 mg each evening to your current meds Please see patient coordinator before you leave today  to schedule sinus ct for next week, not sooner Prednisone 10 mg take  4 each am x 2 days,   2 each am x 2 days,  1 each am x 2 days and stop    07/03/2014 f/u ov/Dewayne Jurek ZO:XWRUEAre:asthma vs uacs   Chief Complaint  Patient presents with  . Follow-up    Pt states that her breathing is some better since the last visit. She has started coughing up minimal yellow/brown x 2 days. She is using albuterol inhaler 1-2 x per wk on average.   lately every time walk across office get sob/ not sure albuterol helps this problem Also hs cough persists  rec dymista one twice daily until you use up the sample and fill the rx if helps you breath through you nose and  if not we need to refer you to an ENT doctor For drainage take chlortrimeton (chlorpheniramine) 4 mg every 4 hours available over the counter (may cause drowsiness)      07/08/2014 acute ov/Jaccob Czaplicki re: sob/ cough/ wheeze Chief Complaint  Patient presents with  . Acute Visit    Pt c/o increased wheezing x 2 days- has used rescue inhaler x 2 today with minimal relief. She is also c/o more SOB than usual.  Her cough has been prod with brown sputum for the past 2 days.   Misunderstood and stopped dulera p last ov and gradually worse since then  rec Augmentin 875 mg take one pill twice daily  X 10 days - take at breakfast and supper with large glass of water.  It would help reduce the usual side effects (diarrhea and yeast infections) if you ate cultured yogurt at lunch.  Prednisone 10 mg take  4 each am x 2 days,   2 each am x 2 days,  1 each am x 2 days and stop  Resume dulera 200 Take 2 puffs first thing in am and then another 2 puffs about 12 hours later.    08/14/2014 f/u ov/Maiah Sinning re: cough variant asthma Chief Complaint  Patient presents with  . Follow-up    Pt states breathing is back to her normal baseline and cough has resolved. No new co's today.  on dulera 200 2bid / singulair no need for saba or cough meds/ still on max gerd rx  rec Try off singulair when you finish it No other changes   06/29/2015  f/u ov/Kavish Lafitte re: asthma/ chronic rhinitis on dulera 200 2bid and flonase/astelin instead of dymista  Chief Complaint  Patient presents with  . Follow-up    Breathing is overall doing well. She c/o nasal congestion.  She has not had to use proair in the past 2 wks.   increased yellow drainage x 2 weeks  But breathing fine and no need for saba at all  rec Take prednisone as  per podiatrist Augmentin 875 mg take one pill twice daily  X 10 days  Start dysmista one twice daily after you finish your antibiotics and just use nasal saline for now    10/04/2015  f/u ov/Lenae Wherley re: cough variant  asthma/chronic rhinitis  Chief Complaint  Patient presents with  . Follow-up    Pt states breathing  is overall doing well. She c/o sinus congestion and minimal non prod cough.  She is using proair 2 x per wk on average.    worse since late April  Sleeping propped up due to breathing but never tries the noct saba  Planning foot surgery this week  rec Augmentin 875 mg take one pill twice daily  X 20 days - take at breakfast and supper   If not 100% better after antibiotics or nose symptoms start back up, call for sinus Ct and referral to ENT as next step    01/07/2016  f/u ov/Perl Folmar re: cough variant asthma on dulera 200 2 bid  Chief Complaint  Patient presents with  . Follow-up    Pt states her breathing is doing well. She c/o constant nasal congestion. She rarely uses rescue inhaler.   Not limited by breathing from desired activities   Main issue is "constant" nasal congestion, min better with dysmista, not with astelin by itself.    No obvious day to day or daytime variabilty or assoc excess/ purulent sputum or mucus plugs   cp or chest tightness, subjective wheeze or overt   hb symptoms. No unusual exp hx or h/o childhood pna/ asthma or knowledge of premature birth.  Sleeping ok without nocturnal  or early am exacerbation  of respiratory  c/o's or need for noct saba. Also denies any obvious fluctuation of symptoms with weather or environmental changes or other aggravating or alleviating factors except as outlined above   Current Medications, Allergies, Complete Past Medical History, Past Surgical History, Family History, and Social History were reviewed in [facility] electronic medical record.  ROS  The following are not active complaints unless bolded sore throat, dysphagia, dental problems, itching, sneezing,  nasal congestion   or excess/ purulent secretions, ear ache,   fever, chills, sweats, unintended wt loss, pleuritic or exertional cp, hemoptysis,  orthopnea pnd or leg  swelling, presyncope, palpitations, heartburn, abdominal pain, anorexia, nausea, vomiting, diarrhea  or change in bowel or urinary habits, change in stools or urine, dysuria,hematuria,  rash, arthralgias, visual complaints, headache, numbness weakness or ataxia or problems with walking or coordination,  change in mood/affect or memory.                 Objective:   Physical Exam  amb wf nad  10/13/2013        209  >  04/22/2014  251 > 06/04/2014  250 >  07/03/2014   253  > 07/08/2014  257 > 08/14/14 260 > 06/30/2015  238 > 10/04/2015 220     09/09/13 201 lb (91.173 kg)  05/16/13 187 lb (84.823 kg)  09/15/11 202 lb (91.627 kg)      HEENT: nl dentition, mucopurulent secretions bilaterally assoc with  Turbinate  Mod severe erythema/swelling R> L  , and nl  orophanx. Nl external ear canals without cough reflex   NECK :  without JVD/Nodes/TM/ nl carotid upstrokes bilaterally   LUNGS: no acc muscle use,   Clear bilaterally to A and P    CV:  RRR  no s3 or murmur or increase in P2, no edema   ABD:  soft and nontender with nl excursion in the supine position. No bruits or organomegaly, bowel sounds nl  MS:  warm without deformities, calf tenderness, cyanosis or clubbing  SKIN: warm and dry without lesions             Assessment & Plan:

## 2016-01-07 NOTE — Patient Instructions (Signed)
Best combination is flonase/astelin = dymista   Please see patient coordinator before you leave today  to schedule sinus ct  Please schedule a follow up visit in 6  months but call sooner if needed

## 2016-02-18 ENCOUNTER — Encounter: Payer: Self-pay | Admitting: Internal Medicine

## 2016-02-18 NOTE — Telephone Encounter (Signed)
I called and spoke with the pt  She states that she had her sinus ct scan today at [facility]  She is having some nasal congestion and states that her dymista does not seem to be working  I advised MW off today, and offered ov with TP, but she refused and stated that this can wait until Monday 10/23 and this is nothing urgent  I checked the fax and still no results on scan yet, pt states she was told it would be faxed here later today or possibly Monday 10/23  I advised we will call her once we get the results and go from there and she agrees with this plan and will call us sooner if needed  I will hold in my basket until results received

## 2016-03-28 ENCOUNTER — Other Ambulatory Visit: Payer: Self-pay | Admitting: Internal Medicine

## 2016-04-20 ENCOUNTER — Other Ambulatory Visit: Payer: Self-pay | Admitting: Obstetrics and Gynecology

## 2016-04-20 LAB — HM PAP SMEAR

## 2016-04-21 LAB — CYTOLOGY - PAP

## 2016-04-25 ENCOUNTER — Encounter: Payer: Self-pay | Admitting: Internal Medicine

## 2016-04-25 LAB — URINALYSIS
Bilirubin, UA: NEGATIVE
Blood, UA: POSITIVE — AB
GLUCOSE, UA: NEGATIVE
Ketones, UA: NEGATIVE
LEUKOCYTES UA: NEGATIVE
Nitrite, UA: NEGATIVE
Protein, UA: NEGATIVE

## 2016-04-25 NOTE — Progress Notes (Signed)
Results entered and sent to scan  

## 2016-06-18 ENCOUNTER — Other Ambulatory Visit: Payer: Self-pay | Admitting: Gastroenterology

## 2016-07-14 ENCOUNTER — Ambulatory Visit: Payer: BLUE CROSS/BLUE SHIELD | Admitting: Internal Medicine

## 2016-07-18 ENCOUNTER — Ambulatory Visit (INDEPENDENT_AMBULATORY_CARE_PROVIDER_SITE_OTHER): Payer: BLUE CROSS/BLUE SHIELD | Admitting: Internal Medicine

## 2016-07-18 ENCOUNTER — Encounter: Payer: Self-pay | Admitting: Internal Medicine

## 2016-07-18 VITALS — BP 134/80 | HR 114 | Ht 69.0 in | Wt 244.0 lb

## 2016-07-18 DIAGNOSIS — J45991 Cough variant asthma: Secondary | ICD-10-CM

## 2016-07-18 LAB — NITRIC OXIDE: Nitric Oxide: 13

## 2016-07-18 MED ORDER — AZELASTINE-FLUTICASONE 137-50 MCG/ACT NA SUSP: 1.0000 | Freq: Two times a day (BID) | NASAL | 0 refills | Status: DC

## 2016-07-18 NOTE — Progress Notes (Signed)
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 Pulmonary Consult    Self referral. Pt c/o cough, SOB, and wheezing for the past 6 days. Her cough is prod with moderate yellow sputum.    worse at hs  Transiently better in past with abx including levaquin/augmentin Mostly sob with cough some better with proair  rec Dulera 100 Take 2 puffs first thing in am and then another 2 puffs about 12 hours later.  sinus CT  Change nexium 40 mg Take 30- 60 min before your first and last meals of the day  GERD  Diet  Prevnar today Late add sinus CT POS  5/13 > augmentin x 21 d and Prednisone 10 mg take  4 each am x 2 days,   2 each am x 2 days,  1 each am x 2 days and stop    10/13/2013 f/u  Pulmonary office visit/ Taquanna Borras on dulera 100 2bid Chief Complaint  Patient presents with  . Cough    PFT done today-- cough has improved since last OV- Still has some sob with exertion, and cough non-productive but imoproved  Not really clear whether it's resting or saba that helps her doe more but only occurs with inclines/ steps rec Increase dulera 200 Take 2 puffs first thing in am and then another 2 puffs about 12 hours later.  Only use your albuterol (proair) as a rescue medication  Weight control    04/22/2014 f/u ov/Jacobe Study re: asthma flare since 04/09/14  Chief Complaint  Patient presents with  . Acute Visit   Pt c/o increased SOB and wheezing- was seen by UC on 04/07/14 and was given neb tx, kenalog inj, abx and pred taper. Her symptoms improved but symptoms started back x 2 days ago.    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 Follow-up    Pt states that her breathing was better, then worse, then better again for the past wk. She is using her proair 1-2 x per day. She still c/o nasal congestion and has non prod cough.   finishing up  20 days of augmentin   Last saba 4 h prior to OV  And starting to "feel tight" again, says can hear her wheezing across the room when it's bad  rec Add singulair 10 mg each evening to your current meds Please see patient coordinator before you leave today  to schedule sinus ct for next week, not sooner Prednisone 10 mg take  4 each am x 2 days,   2 each am x 2 days,  1 each am x 2 days and stop    07/03/2014 f/u ov/Kimberla Driskill ZO:XWRUEAre:asthma vs uacs   Chief Complaint  Patient presents with  . Follow-up    Pt states that her breathing is some better since the last visit. She has started coughing up minimal yellow/brown x 2 days. She is using albuterol inhaler 1-2 x per wk on average.   lately every time walk across office get sob/ not sure albuterol helps this problem Also hs cough persists  rec dymista one twice daily until you use up the sample and fill the rx if helps you breath through you nose and  if not we need to refer you to an ENT doctor For drainage take chlortrimeton (chlorpheniramine) 4 mg every 4 hours available over the counter (may cause drowsiness)      07/08/2014 acute ov/Fahd Galea re: sob/ cough/ wheeze Chief Complaint  Patient presents with  . Acute Visit    Pt c/o increased wheezing x 2 days- has used rescue inhaler x 2 today with minimal relief. She is also c/o more SOB than usual.  Her cough has been prod with brown sputum for the past 2 days.   Misunderstood and stopped dulera p last ov and gradually worse since then  rec Augmentin 875 mg take one pill twice daily  X 10 days - take at breakfast and supper with large glass of water.  It would help reduce the usual side effects (diarrhea and yeast infections) if you ate cultured yogurt at lunch.  Prednisone 10 mg take  4 each am x 2 days,   2 each am x 2 days,  1 each am x 2 days and stop  Resume dulera 200 Take 2 puffs first thing in am and then another 2 puffs about 12 hours later.    08/14/2014 f/u ov/Lawerence Dery re: cough variant asthma Chief Complaint  Patient presents with  . Follow-up    Pt states breathing is back to her normal baseline and cough has resolved. No new co's today.  on dulera 200 2bid / singulair no need for saba or cough meds/ still on max gerd rx  rec Try off singulair when you finish it No other changes   06/29/2015  f/u ov/Mckala Pantaleon re: asthma/ chronic rhinitis on dulera 200 2bid and flonase/astelin instead of dymista  Chief Complaint  Patient presents with  . Follow-up    Breathing is overall doing well. She c/o nasal congestion.  She has not had to use proair in the past 2 wks.   increased yellow drainage x 2 weeks  But breathing fine and no need for saba at all  rec Take prednisone as  per podiatrist Augmentin 875 mg take one pill twice daily  X 10 days  Start dysmista one twice daily after you finish your antibiotics and just use nasal saline for now    10/04/2015  f/u ov/Marshella Tello re: cough variant  asthma/chronic rhinitis  Chief Complaint  Patient presents with  . Follow-up    Pt states breathing  is overall doing well. She c/o sinus congestion and minimal non prod cough.  She is using proair 2 x per wk on average.    worse since late April  Sleeping propped up due to breathing but never tries the noct saba  Planning foot surgery this week  rec Augmentin 875 mg take one pill twice daily  X 20 days - take at breakfast and supper   If not 100% better after antibiotics or nose symptoms start back up, call for sinus Ct and referral to ENT as next step    01/07/2016  f/u ov/Kaion Tisdale re: cough variant asthma on dulera 200 2 bid  Chief Complaint  Patient presents with  . Follow-up    Pt states her breathing is doing well. She c/o constant nasal congestion. She rarely uses rescue inhaler.   Not limited by breathing from desired activities   Main issue is "constant" nasal congestion, min better with dysmista, not with astelin by itself. rec Best combination is flonase/astelin = dymista   schedule sinus ct > Sinus CT 02/18/16 > min muc thickening Post L max sinus, R to L deviation of septum and perf of ant inf nasal septum      07/18/2016  f/u ov/Rosalita Carey re: cough variant asthma  Chief Complaint  Patient presents with  . Follow-up    Breathing is overall doing well. She uses proair 2-3 x per month on average.   even on full doses of dulera 200 or symb 160 at least 2-3 days  A week gets tight in chest some better p albuterol  Attributes to perfume > air freshner at home or work  Some cough when severe tightness but only feels bad enough to take rescue a few times a month and no noct need  No other  obvious day to day or daytime variability or assoc excess/ purulent sputum or mucus plugs or hemoptysis or cp or chest tightness, subjective wheeze or overt sinus or hb symptoms. No unusual exp hx or h/o childhood pna/ asthma or knowledge of premature birth.  Sleeping ok without nocturnal  or early  am exacerbation  of respiratory  c/o's or need for noct saba. Also denies any obvious fluctuation of symptoms with weather or environmental changes or other aggravating or alleviating factors except as outlined above   Current Medications, Allergies, Complete Past Medical History, Past Surgical History, Family History, and Social History were reviewed in [employer] record.  ROS  The following are not active complaints unless bolded sore throat, dysphagia, dental problems, itching, sneezing,  nasal congestion or excess/ purulent secretions, ear ache,   fever, chills, sweats, unintended wt loss, classically pleuritic or exertional cp,  orthopnea pnd or leg swelling, presyncope, palpitations, abdominal pain, anorexia, nausea, vomiting, diarrhea  or change in bowel or bladder habits, change in stools or urine, dysuria,hematuria,  rash, arthralgias, visual complaints, headache, numbness, weakness or ataxia or problems with walking or coordination,  change in mood/affect or memory.                           Objective:   Physical Exam  amb wf nad  10/13/2013        209  >  04/22/2014  251 > 06/04/2014  250 >  07/03/2014   253  > 07/08/2014  257 > 08/14/14 260 > 06/30/2015  238 > 10/04/2015 220 > 07/18/2016  114     09/09/13 201 lb (91.173 kg)  05/16/13 187 lb (84.823 kg)  09/15/11 202 lb (91.627 kg)      HEENT: nl dentition, mucopurulent secretions bilaterally assoc with  Turbinate  Mod severe erythema/swelling R> L  , and nl  orophanx. Nl external ear canals without cough reflex   NECK :  without JVD/Nodes/TM/ nl carotid upstrokes bilaterally   LUNGS: no acc muscle use,   Clear bilaterally to A and P    CV:  RRR  no s3 or murmur or increase in P2, no edema   ABD:  soft and nontender with nl excursion in the supine position. No bruits or organomegaly, bowel sounds nl  MS:  warm without deformities, calf tenderness, cyanosis or clubbing  SKIN: warm and dry without  lesions             Assessment & Plan:

## 2016-07-18 NOTE — Assessment & Plan Note (Addendum)
-  trial of dulera 100 Take 2 bid 09/09/13 > ? Still needing saba with activity but nl pft's p taking saba am 10/13/2013 >  dulera 200 2 bid trial  - flared on dulera 200 with mostly upper airway symptoms > rechallenge with dulera 100 2bid > preferred the 200 dose - allergy profile 06/04/2014 > IgE 10, neg RAST - singulair trial 06/04/2014 > D/c 08/2014   - spirometry off dulera 07/08/14 =  FEV1 down from 4.04 to 2.22(65%) with ratio 67 and clasisic obst f/v curve  - Spirometry 10/04/2015  Nl including [id number]  - Spirometry 07/18/2016  wnl though abn effort dep portion of f/v  p no symb am of ov  - FENO 07/18/2016  =   13   - The proper method of use, as well as anticipated side effects, of a metered-dose inhaler are discussed and demonstrated to the patient. Improved effectiveness after extensive coaching during this visit to a level of approximately 90 % from a baseline of 75  % (delayed breath)     Despite suboptimal hfa, All goals of chronic asthma control met including optimal function and elimination of symptoms with minimal need for rescue therapy unless obvious exposure to irritants which is c/w irritant cough (vs vcd assoc with anxiety)  > true asthma so should be able to try symb 160 one bid as now also on adderal per psych  Contingencies discussed in full including contacting this office immediately if not controlling the symptoms using the rule of two's.      I had an extended discussion with the patient reviewing all relevant studies completed to date and  lasting 15 to 20 minutes of a 25 minute visit    Each maintenance medication was reviewed in detail including most importantly the difference between maintenance and prns and under what circumstances the prns are to be triggered using an action plan format that is not reflected in the computer generated alphabetically organized AVS.    Please see AVS for specific instructions unique to this visit that I personally wrote and verbalized to the  the pt in detail and then reviewed with pt  by my nurse highlighting any  changes in therapy recommended at today's visit to their plan of care.

## 2016-07-18 NOTE — Patient Instructions (Signed)
Plan A = Automatic = symbicort 160 one twice daily   Work on perfecting inhaler technique:  relax and gently blow all the way out then take a nice smooth deep breath back in, triggering the inhaler at same time you start breathing in.  Hold for up to 5 seconds if you can. Blow out thru nose. Rinse and gargle with water when done      Plan B = Backup Only use your albuterol as a rescue medication to be used if you can't catch your breath by resting or doing a relaxed purse lip breathing pattern.  - The less you use it, the better it will work when you need it. - Ok to use the inhaler up to 2 puffs  every 4 hours if you must but call for appointment if use goes up over your usual need - Don't leave home without it !!  (think of it like the spare tire for your car)     If you start needing the rescue more than twice weekly then resume the symbicort 2 every 12 hours   Please schedule a follow up visit in 3 months but call sooner if needed

## 2016-10-20 ENCOUNTER — Ambulatory Visit: Payer: BLUE CROSS/BLUE SHIELD | Admitting: Internal Medicine

## 2016-11-09 ENCOUNTER — Ambulatory Visit: Payer: BLUE CROSS/BLUE SHIELD | Admitting: Internal Medicine

## 2016-11-10 ENCOUNTER — Ambulatory Visit: Payer: BLUE CROSS/BLUE SHIELD | Admitting: Internal Medicine

## 2017-01-24 ENCOUNTER — Ambulatory Visit (INDEPENDENT_AMBULATORY_CARE_PROVIDER_SITE_OTHER): Payer: BLUE CROSS/BLUE SHIELD | Admitting: Podiatry

## 2017-01-24 ENCOUNTER — Other Ambulatory Visit: Payer: Self-pay | Admitting: Internal Medicine

## 2017-01-24 ENCOUNTER — Other Ambulatory Visit: Payer: Self-pay | Admitting: Podiatry

## 2017-01-24 ENCOUNTER — Encounter: Payer: Self-pay | Admitting: Podiatry

## 2017-01-24 ENCOUNTER — Ambulatory Visit (INDEPENDENT_AMBULATORY_CARE_PROVIDER_SITE_OTHER): Payer: BLUE CROSS/BLUE SHIELD

## 2017-01-24 VITALS — BP 128/87 | HR 90 | Resp 16

## 2017-01-24 DIAGNOSIS — M722 Plantar fascial fibromatosis: Secondary | ICD-10-CM

## 2017-01-24 DIAGNOSIS — M79671 Pain in right foot: Secondary | ICD-10-CM | POA: Diagnosis not present

## 2017-01-24 MED ORDER — DICLOFENAC SODIUM 75 MG PO TBEC: 75.0000 mg | DELAYED_RELEASE_TABLET | Freq: Two times a day (BID) | ORAL | 2 refills | Status: DC

## 2017-01-24 MED ORDER — TRIAMCINOLONE ACETONIDE 10 MG/ML IJ SUSP
10.0000 mg | Freq: Once | INTRAMUSCULAR | Status: AC
  Administered 2017-01-24: 10 mg

## 2017-01-24 NOTE — Progress Notes (Signed)
Subjective:    Patient ID: Sabrina Welch, female   DOB: 46 y.o.   MRN: [id number]   HPI patient states she's had a lot of pain in the bottom of her right heel and the left one is doing great and she has not been able to wear orthotics as she had a liter home and was not able to take them with her    ROS      Objective:  Physical Exam neurovascular status intact with patient found to have exquisite discomfort plantar aspect right heel at the insertional point tendon into the calcaneus with inflammation and moderate depression of the arch     Assessment:    Acute plantar fasciitis right with inflammation     Plan:   H&P x-ray reviewed and injected the plantar fascia 3 mg Kenalog 5 mill grams Xylocaine and applied fascial brace. Reappoint to recheck  X-ray indicated spur formation with no indications of stress fracture or arthritis

## 2017-01-24 NOTE — Patient Instructions (Signed)

## 2017-01-26 ENCOUNTER — Other Ambulatory Visit: Payer: Self-pay | Admitting: Internal Medicine

## 2017-01-26 MED ORDER — FLUTICASONE PROPIONATE 50 MCG/ACT NA SUSP: 1.0000 | Freq: Two times a day (BID) | NASAL | 3 refills | Status: DC

## 2017-01-31 ENCOUNTER — Ambulatory Visit (INDEPENDENT_AMBULATORY_CARE_PROVIDER_SITE_OTHER): Payer: BLUE CROSS/BLUE SHIELD | Admitting: Podiatry

## 2017-01-31 ENCOUNTER — Encounter: Payer: Self-pay | Admitting: Podiatry

## 2017-01-31 ENCOUNTER — Telehealth: Payer: Self-pay | Admitting: Podiatry

## 2017-01-31 DIAGNOSIS — M722 Plantar fascial fibromatosis: Secondary | ICD-10-CM | POA: Diagnosis not present

## 2017-01-31 MED ORDER — TRIAMCINOLONE ACETONIDE 10 MG/ML IJ SUSP
10.0000 mg | Freq: Once | INTRAMUSCULAR | Status: AC
  Administered 2017-01-31: 10 mg

## 2017-01-31 MED ORDER — PREDNISONE 10 MG PO TABS: ORAL_TABLET | ORAL | 0 refills | Status: DC

## 2017-01-31 NOTE — Progress Notes (Signed)
Subjective:    Patient ID: Sabrina Welch, female   DOB: 46 y.o.   MRN: [id number]   HPI patient states the right heel is still giving her a lot of problems and she's also has mild discomfort on the left heel    ROS      Objective:  Physical Exam neurovascular status intact with continued discomfort in the plantar heel right with inflammation fluid with mild discomfort on the left     Assessment:    Fasciitis-like symptoms continue to be present right with mild discomfort left   Plan:    H&P condition reviewed and I went ahead and injected the plantar fascia right 3 mg Kenalog 5 mill grams Xylocaine and then dispensed a night splint with instructions on usage along with ice therapy usage. Placed on 12 day Sterapred DS Dosepak and will be seen back for [place] to recheck

## 2017-01-31 NOTE — Telephone Encounter (Signed)
I was seen by Dr. Charlsie Merles this morning and the prednisone was sent to the wrong pharmacy. It was sent to my mail order pharmacy but [facility] [place] [facility] [address].

## 2017-02-05 ENCOUNTER — Telehealth: Payer: Self-pay | Admitting: *Deleted

## 2017-02-05 ENCOUNTER — Encounter: Payer: Self-pay | Admitting: Podiatry

## 2017-02-05 MED ORDER — PREDNISONE 10 MG PO TABS: ORAL_TABLET | ORAL | 0 refills | Status: DC

## 2017-02-05 NOTE — Telephone Encounter (Signed)
Pt emailed that the rx ordered by Dr. Charlsie Merles had not been sent to the [facility] [place]. I changed to [facility] [place] and escribed. Emailed pt the Pred[facility] had [id number] at 10:53am today.

## 2017-02-05 NOTE — Telephone Encounter (Signed)
Sabrina Welch - [facility] called for instructions for Prednisone. I informed Sabrina Welch the prednisone had been [facility] a local [facility].

## 2017-02-28 ENCOUNTER — Ambulatory Visit: Payer: BLUE CROSS/BLUE SHIELD | Admitting: Podiatry

## 2017-03-08 ENCOUNTER — Other Ambulatory Visit: Payer: Self-pay | Admitting: Gastroenterology

## 2017-05-17 ENCOUNTER — Encounter: Payer: Self-pay | Admitting: Podiatry

## 2017-05-17 ENCOUNTER — Ambulatory Visit: Payer: BLUE CROSS/BLUE SHIELD | Admitting: Podiatry

## 2017-05-17 DIAGNOSIS — M722 Plantar fascial fibromatosis: Secondary | ICD-10-CM

## 2017-05-17 MED ORDER — TRIAMCINOLONE ACETONIDE 10 MG/ML IJ SUSP
10.0000 mg | Freq: Once | INTRAMUSCULAR | Status: AC
  Administered 2017-05-17: 10 mg

## 2017-05-17 MED ORDER — TRAMADOL HCL 50 MG PO TABS: 50.0000 mg | ORAL_TABLET | Freq: Three times a day (TID) | ORAL | 2 refills | Status: DC

## 2017-05-17 NOTE — Progress Notes (Signed)
Subjective:   Patient ID: Sabrina Welch, female   DOB: 47 y.o.   MRN: [id number]   HPI Patient states she knows she is getting need to get this right heel fix but she just cannot do it now due to her schedule and she is desperate for any kind of relief   ROS      Objective:  Physical Exam  Exquisite discomfort plantar aspect right heel at the insertional point tendon calcaneus     Assessment:  Acute plantar fasciitis left with inflammation     Plan:  Injected the right plantar fascia 3 mg Kenalog 5 mg Xylocaine and instructed on physical therapy discussed at one point surgical intervention in the future

## 2017-08-24 ENCOUNTER — Encounter: Payer: Self-pay | Admitting: Podiatry

## 2017-08-24 ENCOUNTER — Ambulatory Visit: Payer: BLUE CROSS/BLUE SHIELD | Admitting: Podiatry

## 2017-08-24 DIAGNOSIS — M722 Plantar fascial fibromatosis: Secondary | ICD-10-CM

## 2017-08-24 MED ORDER — METHYLPREDNISOLONE 4 MG PO TBPK: ORAL_TABLET | ORAL | 0 refills | Status: DC

## 2017-08-24 MED ORDER — DICLOFENAC SODIUM 1 % TD GEL: 2.0000 g | Freq: Four times a day (QID) | TRANSDERMAL | 2 refills | Status: DC

## 2017-08-27 DIAGNOSIS — M722 Plantar fascial fibromatosis: Secondary | ICD-10-CM | POA: Insufficient documentation

## 2017-08-27 NOTE — Progress Notes (Signed)
Subjective: 47 year old female presents the office today for concerns of recurrent right heel pain, plantar fasciitis.  She is under the active care of Dr. Charlsie Merles for this.  She states they had discussed surgery but due to personal issues she cannot do this at this time.  She points more to the outside central aspect of the heel where she gets the majority of symptoms.  She denies any recent injury or trauma she states that her pain feels the same as just moved to different location.  She has a history of plantar fascia surgery x2 on the left foot.  No swelling or redness and no numbness or tingling.  Pain does not wake up at night. Denies any systemic complaints such as fevers, chills, nausea, vomiting. No acute changes since last appointment, and no other complaints at this time.   Objective: AAO x3, NAD DP/PT pulses palpable bilaterally, CRT less than 3 seconds Tenderness to palpation along the plantar tubercle of the calcaneus at the insertion of plantar fascia on the right foot. There is no pain along the course of the plantar fascia within the arch of the foot. Plantar fascia appears to be intact. There is no pain with lateral compression of the calcaneus or pain with vibratory sensation. There is no pain along the course or insertion of the achilles tendon. No other areas of tenderness to bilateral lower extremities. Negative Tinel sign No open lesions or pre-ulcerative lesions.  No pain with calf compression, swelling, warmth, erythema  Assessment: Chronic right foot pain, plantar fasciitis  Plan: -All treatment options discussed with the patient including all alternatives, risks, complications.  -At this time discussed another steroid injection.  She is Artie had 3 injections since September.  We will plan on doing one injection as her pain is more to the lateral aspect of her nuclear lateral approach.  Discussed subsequent side effects and potential multiple steroid injections.  Also  prescribed a Medrol Dosepak.  Continue stretching, icing exercises daily.  She is asking for handicap sticker and gave her temporary one today. -Follow-up in 3 weeks with Dr. Charlsie Merles or sooner if needed. -Patient encouraged to call the office with any questions, concerns, change in symptoms.   Procedure: Injection Tendon/Ligament Discussed alternatives, risks, complications and verbal consent was obtained.  Location: Right plantar fascia at the glabrous junction; LATERAL approach. Skin Prep: Alcohol. Injectate: 0.5cc 0.5% marcaine plain, 0.5 cc 2% lidocaine plain and, 1 cc kenalog 10. Disposition: Patient tolerated procedure well. Injection site dressed with a band-aid.  Post-injection care was discussed and return precautions discussed.   [name] DPM

## 2017-08-29 ENCOUNTER — Other Ambulatory Visit: Payer: Self-pay

## 2017-08-29 MED ORDER — NONFORMULARY OR COMPOUNDED ITEM: 120.0000 g | Freq: Four times a day (QID) | 2 refills | Status: DC

## 2017-09-05 ENCOUNTER — Encounter: Payer: Self-pay | Admitting: Podiatry

## 2017-09-05 ENCOUNTER — Other Ambulatory Visit: Payer: Self-pay

## 2017-09-05 ENCOUNTER — Ambulatory Visit (INDEPENDENT_AMBULATORY_CARE_PROVIDER_SITE_OTHER): Payer: BLUE CROSS/BLUE SHIELD

## 2017-09-05 ENCOUNTER — Ambulatory Visit: Payer: BLUE CROSS/BLUE SHIELD | Admitting: Podiatry

## 2017-09-05 ENCOUNTER — Other Ambulatory Visit: Payer: Self-pay | Admitting: Podiatry

## 2017-09-05 DIAGNOSIS — M722 Plantar fascial fibromatosis: Secondary | ICD-10-CM | POA: Diagnosis not present

## 2017-09-05 DIAGNOSIS — E7800 Pure hypercholesterolemia, unspecified: Secondary | ICD-10-CM | POA: Insufficient documentation

## 2017-09-05 DIAGNOSIS — M79671 Pain in right foot: Secondary | ICD-10-CM

## 2017-09-05 DIAGNOSIS — N920 Excessive and frequent menstruation with regular cycle: Secondary | ICD-10-CM | POA: Insufficient documentation

## 2017-09-05 DIAGNOSIS — G8918 Other acute postprocedural pain: Secondary | ICD-10-CM | POA: Insufficient documentation

## 2017-09-05 MED ORDER — TRAMADOL HCL 50 MG PO TABS: 50.0000 mg | ORAL_TABLET | Freq: Three times a day (TID) | ORAL | 2 refills | Status: DC

## 2017-09-05 NOTE — Progress Notes (Signed)
Subjective:   Patient ID: Sabrina Welch, female   DOB: 47 y.o.   MRN: [id number]   HPI Patient presents stating she has developed severe pain in the bottom of the right heel after she went to physical therapy and is very hard for her to bear weight on her heel currently.  States that she did have an injection which helped her temporarily but the physical therapy seemed to create a problem   ROS      Objective:  Physical Exam  Neurovascular status intact with patient having pain mostly centered in the right heel with inflammation fluid buildup noted upon palpation     Assessment:  Possibility for stress fracture right heel versus acute inflammatory condition     Plan:  H&P condition reviewed and advised this patient on aggressive ice therapy beginning tramadol 50 mg 3 times daily and complete boot usage and reappoint several weeks and some day we most likely have to do surgery like we did on the left heel  X-rays indicate spur but I did not see signs that there appeared to be stress fracture currently

## 2017-09-13 ENCOUNTER — Ambulatory Visit: Payer: BLUE CROSS/BLUE SHIELD | Admitting: Podiatry

## 2017-09-18 ENCOUNTER — Other Ambulatory Visit: Payer: Self-pay | Admitting: Gastroenterology

## 2017-09-18 NOTE — Telephone Encounter (Signed)
LM for pt that she needs to schedule an appt for refills.  It has been over 2 years since we have seen her.  She has been on a PPI for years.

## 2017-09-19 NOTE — Telephone Encounter (Signed)
Appointment scheduled. Patient is requesting nexium refill. [facility].

## 2017-09-20 ENCOUNTER — Ambulatory Visit: Payer: BLUE CROSS/BLUE SHIELD | Admitting: Podiatry

## 2017-11-16 ENCOUNTER — Encounter: Payer: Self-pay | Admitting: Gastroenterology

## 2017-11-16 ENCOUNTER — Other Ambulatory Visit (INDEPENDENT_AMBULATORY_CARE_PROVIDER_SITE_OTHER): Payer: BLUE CROSS/BLUE SHIELD

## 2017-11-16 ENCOUNTER — Other Ambulatory Visit: Payer: Self-pay

## 2017-11-16 ENCOUNTER — Ambulatory Visit: Payer: BLUE CROSS/BLUE SHIELD | Admitting: Gastroenterology

## 2017-11-16 ENCOUNTER — Telehealth: Payer: Self-pay | Admitting: Gastroenterology

## 2017-11-16 VITALS — BP 128/82 | HR 110 | Ht 69.0 in | Wt 235.0 lb

## 2017-11-16 DIAGNOSIS — K219 Gastro-esophageal reflux disease without esophagitis: Secondary | ICD-10-CM

## 2017-11-16 DIAGNOSIS — K589 Irritable bowel syndrome without diarrhea: Secondary | ICD-10-CM

## 2017-11-16 LAB — BASIC METABOLIC PANEL
BUN: 10 mg/dL (ref 6–23)
CALCIUM: 8.8 mg/dL (ref 8.4–10.5)
CO2: 28 meq/L (ref 19–32)
CREATININE: 0.82 mg/dL (ref 0.40–1.20)
Chloride: 103 mEq/L (ref 96–112)
GFR: 79.87 mL/min (ref >60.00)
Glucose, Bld: 114 mg/dL — ABNORMAL HIGH (ref 70–99)
Potassium: 3.6 mEq/L (ref 3.5–5.1)
Sodium: 137 mEq/L (ref 135–145)

## 2017-11-16 MED ORDER — ONDANSETRON 4 MG PO TBDP: 4.0000 mg | ORAL_TABLET | Freq: Three times a day (TID) | ORAL | 3 refills | Status: DC | PRN

## 2017-11-16 MED ORDER — RIFAXIMIN 550 MG PO TABS: 550.0000 mg | ORAL_TABLET | Freq: Three times a day (TID) | ORAL | 0 refills | Status: DC

## 2017-11-16 MED ORDER — DICYCLOMINE HCL 10 MG PO CAPS: 10.0000 mg | ORAL_CAPSULE | Freq: Two times a day (BID) | ORAL | 3 refills | Status: DC | PRN

## 2017-11-16 MED ORDER — ESOMEPRAZOLE MAGNESIUM 40 MG PO CPDR: 40.0000 mg | DELAYED_RELEASE_CAPSULE | Freq: Two times a day (BID) | ORAL | 3 refills | Status: DC

## 2017-11-16 NOTE — Telephone Encounter (Signed)
All scripts sent to [facility] [place]

## 2017-11-16 NOTE — Progress Notes (Unsigned)
[facility] [place]

## 2017-11-16 NOTE — Patient Instructions (Addendum)
If you are age 46 or older, your body mass index should be between 23-30. Your Body mass index is 34.7 kg/m. If this is out of the aforementioned range listed, please consider follow up with your Primary Care Provider.  If you are age 46 or younger, your body mass index should be between 19-25. Your Body mass index is 34.7 kg/m. If this is out of the aformentioned range listed, please consider follow up with your Primary Care Provider.   We have sent the following medications to your pharmacy for you to pick up at your convenience: Nexium Zofran Bentyl  New prescription for Xifaxan 550 mg: Take three times a day for 2 weeks  Please go to the lab in the basement of our building to have lab work done as you leave today.  Thank you for entrusting me with your care and for choosing [facility], Dr. Ileene PatrickSteven Armbruster
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
## 2017-11-20 ENCOUNTER — Other Ambulatory Visit: Payer: Self-pay

## 2017-11-20 ENCOUNTER — Encounter: Payer: Self-pay | Admitting: Gastroenterology

## 2017-11-20 MED ORDER — ONDANSETRON 4 MG PO TBDP: 4.0000 mg | ORAL_TABLET | Freq: Three times a day (TID) | ORAL | 0 refills | Status: AC | PRN

## 2017-11-20 NOTE — Progress Notes (Signed)
Per pharmacy fax, Pt requesting 90 day script.

## 2018-12-07 ENCOUNTER — Other Ambulatory Visit: Payer: Self-pay | Admitting: Gastroenterology

## 2019-02-12 ENCOUNTER — Other Ambulatory Visit: Payer: Self-pay | Admitting: Gastroenterology

## 2019-02-17 ENCOUNTER — Other Ambulatory Visit: Payer: Self-pay | Admitting: Gastroenterology

## 2019-03-11 ENCOUNTER — Other Ambulatory Visit: Payer: Self-pay | Admitting: Gastroenterology

## 2019-03-25 ENCOUNTER — Other Ambulatory Visit: Payer: Self-pay | Admitting: Gastroenterology

## 2019-05-21 ENCOUNTER — Telehealth: Payer: BLUE CROSS/BLUE SHIELD | Admitting: Family

## 2019-05-21 DIAGNOSIS — B9789 Other viral agents as the cause of diseases classified elsewhere: Secondary | ICD-10-CM | POA: Diagnosis not present

## 2019-05-21 DIAGNOSIS — J329 Chronic sinusitis, unspecified: Secondary | ICD-10-CM

## 2019-05-21 DIAGNOSIS — M546 Pain in thoracic spine: Secondary | ICD-10-CM | POA: Diagnosis not present

## 2019-05-21 MED ORDER — BACLOFEN 10 MG PO TABS: 10.0000 mg | ORAL_TABLET | Freq: Three times a day (TID) | ORAL | 0 refills | Status: DC

## 2019-05-21 NOTE — Progress Notes (Signed)
We are sorry you are not feeling well.  Here is how we plan to help!  Based on what you have shared with me, it looks like you may have a viral upper respiratory infection.  Upper respiratory infections are caused by a large number of viruses; however, rhinovirus is the most common cause.   Symptoms vary from person to person, with common symptoms including sore throat, cough, fatigue or lack of energy and feeling of general discomfort.  A low-grade fever of up to 100.4 may present, but is often uncommon.  Symptoms vary however, and are closely related to a person's age or underlying illnesses.  The most common symptoms associated with an upper respiratory infection are nasal discharge or congestion, cough, sneezing, headache and pressure in the ears and face.  These symptoms usually persist for about 3 to 10 days, but can last up to 2 weeks.  It is important to know that upper respiratory infections do not cause serious illness or complications in most cases.    Upper respiratory infections can be transmitted from person to person, with the most common method of transmission being a person's hands.  The virus is able to live on the skin and can infect other persons for up to 2 hours after direct contact.  Also, these can be transmitted when someone coughs or sneezes; thus, it is important to cover the mouth to reduce this risk.  To keep the spread of the illness at [place], good hand hygiene is very important.  This is an infection that is most likely caused by a virus. There are no specific treatments other than to help you with the symptoms until the infection runs its course.  We are sorry you are not feeling well.  Here is how we plan to help!   For nasal congestion, you may use an oral decongestants such as Mucinex D or if you have glaucoma or high blood pressure use plain Mucinex.  Saline nasal spray or nasal drops can help and can safely be used as often as needed for congestion.  For your congestion,  I have prescribed Fluticasone nasal spray one spray in each nostril twice a day  If you do not have a history of heart disease, hypertension, diabetes or thyroid disease, prostate/bladder issues or glaucoma, you may also use Sudafed to treat nasal congestion.  It is highly recommended that you consult with a pharmacist or your primary care physician to ensure this medication is safe for you to take.     If you have a cough, you may use cough suppressants such as Delsym and Robitussin.  If you have glaucoma or high blood pressure, you can also use Coricidin HBP.     For you back pain, I have sent in a prescription of baclofen 10 mg that you can take three times a day as needed.   If you have a sore or scratchy throat, use a saltwater gargle-  to  teaspoon of salt dissolved in a 4-ounce to 8-ounce glass of warm water.  Gargle the solution for approximately 15-30 seconds and then spit.  It is important not to swallow the solution.  You can also use throat lozenges/cough drops and Chloraseptic spray to help with throat pain or discomfort.  Warm or cold liquids can also be helpful in relieving throat pain.  For headache, pain or general discomfort, you can use Ibuprofen or Tylenol as directed.   Some authorities believe that zinc sprays or the use of Echinacea  may shorten the course of your symptoms.   HOME CARE . Only take medications as instructed by your medical team. . Be sure to drink plenty of fluids. Water is fine as well as fruit juices, sodas and electrolyte beverages. You may want to stay away from caffeine or alcohol. If you are nauseated, try taking small sips of liquids. How do you know if you are getting enough fluid? Your urine should be a pale yellow or almost colorless. . Get rest. . Taking a steamy shower or using a humidifier may help nasal congestion and ease sore throat pain. You can place a towel over your head and breathe in the steam from hot water coming from a  faucet. . Using a saline nasal spray works much the same way. . Cough drops, hard candies and sore throat lozenges may ease your cough. . Avoid close contacts especially the very young and the elderly . Cover your mouth if you cough or sneeze . Always remember to wash your hands.   GET HELP RIGHT AWAY IF: . You develop worsening fever. . If your symptoms do not improve within 10 days . You develop yellow or green discharge from your nose over 3 days. . You have coughing fits . You develop a severe head ache or visual changes. . You develop shortness of breath, difficulty breathing or start having chest pain . Your symptoms persist after you have completed your treatment plan  MAKE SURE YOU   Understand these instructions.  Will watch your condition.  Will get help right away if you are not doing well or get worse.  Your e-visit answers were reviewed by a board certified advanced clinical practitioner to complete your personal care plan. Depending upon the condition, your plan could have included both over the counter or prescription medications. Please review your pharmacy choice. If there is a problem, you may call our nursing hot line at and have the prescription routed to another pharmacy. Your safety is important to [place]. If you have drug allergies check your prescription carefully.   You can use MyChart to ask questions about today's visit, request a non-urgent call back, or ask for a work or school excuse for 24 hours related to this e-Visit. If it has been greater than 24 hours you will need to follow up with your provider, or enter a new e-Visit to address those concerns. You will get an e-mail in the next two days asking about your experience.  I hope that your e-visit has been valuable and will speed your recovery. Thank you for using e-visits.   Approximately 5 minutes was spent documenting and reviewing patient's chart.

## 2020-05-04 ENCOUNTER — Telehealth: Payer: BC Managed Care – PPO | Admitting: Emergency Medicine

## 2020-05-04 DIAGNOSIS — U071 COVID-19: Secondary | ICD-10-CM

## 2020-05-04 MED ORDER — BENZONATATE 100 MG PO CAPS: 100.0000 mg | ORAL_CAPSULE | Freq: Two times a day (BID) | ORAL | 0 refills | Status: DC | PRN

## 2020-05-04 NOTE — Progress Notes (Signed)
E-Visit for Corona Virus Screening  We are sorry you are not feeling well. We are here to help!  You have tested positive for COVID-19, meaning that you were infected with the novel coronavirus and could give the virus to others.  It is vitally important that you stay home so you do not spread it to others.      Please continue isolation at home, for at least 10 days since the start of your symptoms and until you have had 24 hours with no fever (without taking a fever reducer) and with improving of symptoms.  If you have no symptoms but tested positive (or all symptoms resolve after 5 days and you have no fever) you can leave your house but continue to wear a mask around others for an additional 5 days. If you have a fever,continue to stay home until you have had 24 hours of no fever. Most cases improve 5-10 days from onset but we have seen a small number of patients who have gotten worse after the 10 days.  Please be sure to watch for worsening symptoms and remain taking the proper precautions.   Your family members should do the same.  Go to the nearest hospital ED for assessment if fever/cough/breathlessness are severe or illness seems like a threat to life.    The following symptoms may appear 2-14 days after exposure: . Fever . Cough . Shortness of breath or difficulty breathing . Chills . Repeated shaking with chills . Muscle pain . Headache . Sore throat . New loss of taste or smell . Fatigue . Congestion or runny nose . Nausea or vomiting . Diarrhea  You have been enrolled in [facility] Monitoring for COVID-19. Daily you will receive a questionnaire within the MyChart website. Our COVID-19 response team will be monitoring your responses daily.  You can use medication such as A prescription cough medication called Tessalon Perles 100 mg. You may take 1-2 capsules every 8 hours as needed for cough     You may also take acetaminophen (Tylenol) as needed for fever.  HOME  CARE: . Only take medications as instructed by your medical team. . Drink plenty of fluids and get plenty of rest. . A steam or ultrasonic humidifier can help if you have congestion.   GET HELP RIGHT AWAY IF YOU HAVE EMERGENCY WARNING SIGNS.  Call 911 or proceed to your closest emergency facility if: . You develop worsening high fever. . Trouble breathing . Bluish lips or face . Persistent pain or pressure in the chest . New confusion . Inability to wake or stay awake . You cough up blood. . Your symptoms become more severe . Inability to hold down food or fluids  This list is not all possible symptoms. Contact your medical provider for any symptoms that are severe or concerning to you.    Your e-visit answers were reviewed by a board certified advanced clinical practitioner to complete your personal care plan.  Depending on the condition, your plan could have included both over the counter or prescription medications.  If there is a problem please reply once you have received a response from your provider.  Your safety is important to [place].  If you have drug allergies check your prescription carefully.    You can use MyChart to ask questions about today's visit, request a non-urgent call back, or ask for a work or school excuse for 24 hours related to this e-Visit. If it has been greater than 24  hours you will need to follow up with your provider, or enter a new e-Visit to address those concerns. You will get an e-mail in the next two days asking about your experience.  I hope that your e-visit has been valuable and will speed your recovery. Thank you for using e-visits.     Approximately 5 minutes was used in reviewing the patient's chart, questionnaire, prescribing medications, and documentation.

## 2020-05-11 ENCOUNTER — Telehealth: Payer: BC Managed Care – PPO | Admitting: Nurse Practitioner

## 2020-05-11 DIAGNOSIS — J40 Bronchitis, not specified as acute or chronic: Secondary | ICD-10-CM | POA: Diagnosis not present

## 2020-05-11 MED ORDER — AZITHROMYCIN 250 MG PO TABS: ORAL_TABLET | ORAL | 0 refills | Status: DC

## 2020-05-11 MED ORDER — PREDNISONE 10 MG (21) PO TBPK: ORAL_TABLET | ORAL | 0 refills | Status: DC

## 2020-05-11 NOTE — Progress Notes (Signed)
We are sorry that you are not feeling well.  Here is how we plan to help!  Based on your presentation I believe you most likely have A cough due to bacteria.  When patients have a fever and a productive cough with a change in color or increased sputum production, we are concerned about bacterial bronchitis.  If left untreated it can progress to pneumonia.  If your symptoms do not improve with your treatment plan it is important that you contact your provider.   I have prescribed Azithromyin 250 mg: two tablets now and then one tablet daily for 4 additonal days    In addition you may use A non-prescription cough medication called Mucinex DM: take 2 tablets every 12 hours.  Prednisone 10 mg daily for 6 days (see taper instructions below)  Directions for 6 day taper: Day 1: 2 tablets before breakfast, 1 after both lunch & dinner and 2 at bedtime Day 2: 1 tab before breakfast, 1 after both lunch & dinner and 2 at bedtime Day 3: 1 tab at each meal & 1 at bedtime Day 4: 1 tab at breakfast, 1 at lunch, 1 at bedtime Day 5: 1 tab at breakfast & 1 tab at bedtime Day 6: 1 tab at breakfast   From your responses in the eVisit questionnaire you describe inflammation in the upper respiratory tract which is causing a significant cough.  This is commonly called Bronchitis and has four common causes:    Allergies  Viral Infections  Acid Reflux  Bacterial Infection Allergies, viruses and acid reflux are treated by controlling symptoms or eliminating the cause. An example might be a cough caused by taking certain blood pressure medications. You stop the cough by changing the medication. Another example might be a cough caused by acid reflux. Controlling the reflux helps control the cough.  USE OF BRONCHODILATOR ("RESCUE") INHALERS: There is a risk from using your bronchodilator too frequently.  The risk is that over-reliance on a medication which only relaxes the muscles surrounding the breathing tubes can  reduce the effectiveness of medications prescribed to reduce swelling and congestion of the tubes themselves.  Although you feel brief relief from the bronchodilator inhaler, your asthma may actually be worsening with the tubes becoming more swollen and filled with mucus.  This can delay other crucial treatments, such as oral steroid medications. If you need to use a bronchodilator inhaler daily, several times per day, you should discuss this with your provider.  There are probably better treatments that could be used to keep your asthma under control.     HOME CARE . Only take medications as instructed by your medical team. . Complete the entire course of an antibiotic. . Drink plenty of fluids and get plenty of rest. . Avoid close contacts especially the very young and the elderly . Cover your mouth if you cough or cough into your sleeve. . Always remember to wash your hands . A steam or ultrasonic humidifier can help congestion.   GET HELP RIGHT AWAY IF: . You develop worsening fever. . You become short of breath . You cough up blood. . Your symptoms persist after you have completed your treatment plan MAKE SURE YOU   Understand these instructions.  Will watch your condition.  Will get help right away if you are not doing well or get worse.  Your e-visit answers were reviewed by a board certified advanced clinical practitioner to complete your personal care plan.  Depending on the condition,   your plan could have included both over the counter or prescription medications. If there is a problem please reply  once you have received a response from your provider. Your safety is important to us.  If you have drug allergies check your prescription carefully.    You can use MyChart to ask questions about today's visit, request a non-urgent call back, or ask for a work or school excuse for 24 hours related to this e-Visit. If it has been greater than 24 hours you will need to follow up with  your provider, or enter a new e-Visit to address those concerns. You will get an e-mail in the next two days asking about your experience.  I hope that your e-visit has been valuable and will speed your recovery. Thank you for using e-visits.  5-10 minutes spent reviewing and documenting in chart.  

## 2020-12-16 ENCOUNTER — Telehealth: Payer: BC Managed Care – PPO | Admitting: Physician Assistant

## 2020-12-16 DIAGNOSIS — J019 Acute sinusitis, unspecified: Secondary | ICD-10-CM | POA: Diagnosis not present

## 2020-12-16 DIAGNOSIS — B9689 Other specified bacterial agents as the cause of diseases classified elsewhere: Secondary | ICD-10-CM

## 2020-12-16 DIAGNOSIS — Z8616 Personal history of COVID-19: Secondary | ICD-10-CM | POA: Diagnosis not present

## 2020-12-16 MED ORDER — AMOXICILLIN-POT CLAVULANATE 875-125 MG PO TABS: 1.0000 | ORAL_TABLET | Freq: Two times a day (BID) | ORAL | 0 refills | Status: DC

## 2020-12-16 NOTE — Progress Notes (Signed)

## 2020-12-16 NOTE — Progress Notes (Signed)
I have spent 5 minutes in review of e-visit questionnaire, review and updating patient chart, medical decision making and response to patient.   Alannis Hsia Cody Zamariya Neal, PA-C    

## 2021-06-21 ENCOUNTER — Other Ambulatory Visit (HOSPITAL_COMMUNITY): Payer: Self-pay

## 2021-06-21 MED ORDER — AMPHETAMINE-DEXTROAMPHET ER 20 MG PO CP24
40.0000 mg | ORAL_CAPSULE | Freq: Every morning | ORAL | 0 refills | Status: AC
  Filled 2021-06-21: qty 50, 25d supply, fill #0
  Filled 2021-06-21: qty 10, 5d supply, fill #0
  Filled 2021-06-21: qty 50, 25d supply, fill #0

## 2021-06-21 MED ORDER — ZOLPIDEM TARTRATE 10 MG PO TABS
10.0000 mg | ORAL_TABLET | Freq: Every day | ORAL | 0 refills | Status: DC
  Filled 2021-06-21: qty 30, 30d supply, fill #0
  Filled 2021-07-27: qty 30, 30d supply, fill #1
  Filled 2021-08-29: qty 30, 30d supply, fill #2

## 2021-06-21 MED ORDER — AMPHETAMINE-DEXTROAMPHETAMINE 20 MG PO TABS
20.0000 mg | ORAL_TABLET | Freq: Every day | ORAL | 0 refills | Status: DC
  Filled 2021-06-21: qty 30, 30d supply, fill #0

## 2021-06-21 MED ORDER — LORAZEPAM 1 MG PO TABS
1.0000 mg | ORAL_TABLET | Freq: Three times a day (TID) | ORAL | 0 refills | Status: DC | PRN
  Filled 2021-06-21: qty 180, 30d supply, fill #0
  Filled 2021-07-27: qty 180, 30d supply, fill #1
  Filled 2021-08-29: qty 180, 30d supply, fill #2

## 2021-07-27 ENCOUNTER — Other Ambulatory Visit (HOSPITAL_COMMUNITY): Payer: Self-pay

## 2021-07-27 MED ORDER — AMPHETAMINE-DEXTROAMPHETAMINE 20 MG PO TABS
20.0000 mg | ORAL_TABLET | Freq: Every day | ORAL | 0 refills | Status: DC
  Filled 2021-07-27 – 2021-08-04 (×2): qty 30, 30d supply, fill #0

## 2021-08-04 ENCOUNTER — Other Ambulatory Visit (HOSPITAL_COMMUNITY): Payer: Self-pay

## 2021-08-12 ENCOUNTER — Other Ambulatory Visit (HOSPITAL_COMMUNITY): Payer: Self-pay

## 2021-08-29 ENCOUNTER — Other Ambulatory Visit (HOSPITAL_COMMUNITY): Payer: Self-pay

## 2021-09-30 ENCOUNTER — Other Ambulatory Visit (HOSPITAL_COMMUNITY): Payer: Self-pay

## 2021-09-30 MED ORDER — LORAZEPAM 1 MG PO TABS
1.0000 mg | ORAL_TABLET | Freq: Three times a day (TID) | ORAL | 0 refills | Status: DC | PRN
  Filled 2021-09-30: qty 180, 30d supply, fill #0
  Filled 2021-11-04: qty 180, 30d supply, fill #1
  Filled 2021-12-13: qty 180, 30d supply, fill #2

## 2021-09-30 MED ORDER — ZOLPIDEM TARTRATE 10 MG PO TABS
10.0000 mg | ORAL_TABLET | Freq: Every day | ORAL | 0 refills | Status: DC
  Filled 2021-09-30: qty 30, 30d supply, fill #0
  Filled 2021-11-04: qty 30, 30d supply, fill #1
  Filled 2021-12-13: qty 30, 30d supply, fill #2

## 2021-10-03 ENCOUNTER — Other Ambulatory Visit (HOSPITAL_COMMUNITY): Payer: Self-pay

## 2021-10-03 MED ORDER — AMPHETAMINE-DEXTROAMPHETAMINE 20 MG PO TABS
20.0000 mg | ORAL_TABLET | Freq: Three times a day (TID) | ORAL | 0 refills | Status: DC
  Filled 2021-10-03: qty 90, 30d supply, fill #0

## 2021-10-25 ENCOUNTER — Other Ambulatory Visit (HOSPITAL_COMMUNITY): Payer: Self-pay

## 2021-11-04 ENCOUNTER — Other Ambulatory Visit (HOSPITAL_COMMUNITY): Payer: Self-pay

## 2021-11-04 MED ORDER — AMPHETAMINE-DEXTROAMPHETAMINE 20 MG PO TABS
20.0000 mg | ORAL_TABLET | Freq: Three times a day (TID) | ORAL | 0 refills | Status: DC
  Filled 2021-11-04: qty 90, 30d supply, fill #0

## 2021-12-13 ENCOUNTER — Other Ambulatory Visit (HOSPITAL_COMMUNITY): Payer: Self-pay

## 2021-12-13 MED ORDER — AMPHETAMINE-DEXTROAMPHETAMINE 20 MG PO TABS
20.0000 mg | ORAL_TABLET | Freq: Three times a day (TID) | ORAL | 0 refills | Status: DC
  Filled 2021-12-13: qty 59, 20d supply, fill #0
  Filled 2021-12-14: qty 31, 10d supply, fill #0

## 2021-12-14 ENCOUNTER — Other Ambulatory Visit (HOSPITAL_COMMUNITY): Payer: Self-pay

## 2022-01-12 ENCOUNTER — Other Ambulatory Visit (HOSPITAL_COMMUNITY): Payer: Self-pay

## 2022-01-12 MED ORDER — AMPHETAMINE-DEXTROAMPHETAMINE 20 MG PO TABS
20.0000 mg | ORAL_TABLET | Freq: Three times a day (TID) | ORAL | 0 refills | Status: DC
  Filled 2022-01-12: qty 90, 30d supply, fill #0

## 2022-01-12 MED ORDER — LORAZEPAM 1 MG PO TABS
1.0000 mg | ORAL_TABLET | Freq: Three times a day (TID) | ORAL | 0 refills | Status: DC | PRN
  Filled 2022-01-12: qty 180, 30d supply, fill #0
  Filled 2022-02-15: qty 180, 30d supply, fill #1
  Filled 2022-03-28: qty 180, 30d supply, fill #2

## 2022-01-12 MED ORDER — ZOLPIDEM TARTRATE 10 MG PO TABS
10.0000 mg | ORAL_TABLET | Freq: Every day | ORAL | 0 refills | Status: DC
  Filled 2022-01-12: qty 30, 30d supply, fill #0
  Filled 2022-02-15: qty 30, 30d supply, fill #1
  Filled 2022-03-28: qty 30, 30d supply, fill #2

## 2022-01-13 ENCOUNTER — Other Ambulatory Visit (HOSPITAL_COMMUNITY): Payer: Self-pay

## 2022-02-02 ENCOUNTER — Telehealth: Payer: BC Managed Care – PPO | Admitting: Family Medicine

## 2022-02-02 ENCOUNTER — Encounter: Payer: Self-pay | Admitting: Family Medicine

## 2022-02-02 DIAGNOSIS — B9689 Other specified bacterial agents as the cause of diseases classified elsewhere: Secondary | ICD-10-CM | POA: Diagnosis not present

## 2022-02-02 DIAGNOSIS — J019 Acute sinusitis, unspecified: Secondary | ICD-10-CM

## 2022-02-02 MED ORDER — AMOXICILLIN-POT CLAVULANATE 875-125 MG PO TABS: 1.0000 | ORAL_TABLET | Freq: Two times a day (BID) | ORAL | 0 refills | Status: AC

## 2022-02-02 MED ORDER — FLUCONAZOLE 150 MG PO TABS: 150.0000 mg | ORAL_TABLET | Freq: Once | ORAL | 0 refills | Status: AC

## 2022-02-02 MED ORDER — DOXYCYCLINE HYCLATE 100 MG PO TABS: 100.0000 mg | ORAL_TABLET | Freq: Two times a day (BID) | ORAL | 0 refills | Status: DC

## 2022-02-02 NOTE — Addendum Note (Signed)
Addended by: Perlie Mayo on: 02/02/2022 02:46 PM   Modules accepted: Orders

## 2022-02-02 NOTE — Progress Notes (Signed)

## 2022-02-15 ENCOUNTER — Other Ambulatory Visit (HOSPITAL_COMMUNITY): Payer: Self-pay

## 2022-02-15 MED ORDER — AMPHETAMINE-DEXTROAMPHETAMINE 20 MG PO TABS
20.0000 mg | ORAL_TABLET | Freq: Three times a day (TID) | ORAL | 0 refills | Status: DC
  Filled 2022-02-15: qty 90, 30d supply, fill #0

## 2022-03-22 ENCOUNTER — Telehealth: Payer: BC Managed Care – PPO | Admitting: Physician Assistant

## 2022-03-22 DIAGNOSIS — B370 Candidal stomatitis: Secondary | ICD-10-CM

## 2022-03-22 DIAGNOSIS — M5441 Lumbago with sciatica, right side: Secondary | ICD-10-CM

## 2022-03-22 MED ORDER — NAPROXEN 500 MG PO TABS: 500.0000 mg | ORAL_TABLET | Freq: Two times a day (BID) | ORAL | 0 refills | Status: AC

## 2022-03-22 MED ORDER — CYCLOBENZAPRINE HCL 10 MG PO TABS: 5.0000 mg | ORAL_TABLET | Freq: Three times a day (TID) | ORAL | 0 refills | Status: AC | PRN

## 2022-03-22 MED ORDER — NYSTATIN 100000 UNIT/ML MT SUSP: 5.0000 mL | Freq: Four times a day (QID) | OROMUCOSAL | 0 refills | Status: DC

## 2022-03-22 NOTE — Progress Notes (Signed)
We are sorry that you are not feeling well.  Here is how we plan to help!  Based on what you have shared with me it looks like you mostly have acute back pain.  Acute back pain is defined as musculoskeletal pain that can resolve in 1-3 weeks with conservative treatment.  I have prescribed Naprosyn 500 mg take one by mouth twice a day non-steroid anti-inflammatory (NSAID) as well as Flexeril 10 mg every eight hours as needed which is a muscle relaxer  Some patients experience stomach irritation or in increased heartburn with anti-inflammatory drugs.  Please keep in mind that muscle relaxer's can cause fatigue and should not be taken while at work or driving.  Back pain is very common.  The pain often gets better over time.  The cause of back pain is usually not dangerous.  Most people can learn to manage their back pain on their own.  I have also prescribed Nystatin suspension for the possible thrush.   If symptoms do not improve in 48 hours or if they worsen please seek in person evaluation for the back pain.   Home Care Stay active.  Start with short walks on flat ground if you can.  Try to walk farther each day. Do not sit, drive or stand in one place for more than 30 minutes.  Do not stay in bed. Do not avoid exercise or work.  Activity can help your back heal faster. Be careful when you bend or lift an object.  Bend at your knees, keep the object close to you, and do not twist. Sleep on a firm mattress.  Lie on your side, and bend your knees.  If you lie on your back, put a pillow under your knees. Only take medicines as told by your doctor. Put ice on the injured area. Put ice in a plastic bag Place a towel between your skin and the bag Leave the ice on for 15-20 minutes, 3-4 times a day for the first 2-3 days. 210 After that, you can switch between ice and heat packs. Ask your doctor about back exercises or massage. Avoid feeling anxious or stressed.  Find good ways to deal with stress,  such as exercise.  Get Help Right Way If: Your pain does not go away with rest or medicine. Your pain does not go away in 1 week. You have new problems. You do not feel well. The pain spreads into your legs. You cannot control when you poop (bowel movement) or pee (urinate) You feel sick to your stomach (nauseous) or throw up (vomit) You have belly (abdominal) pain. You feel like you may pass out (faint). If you develop a fever.  Make Sure you: Understand these instructions. Will watch your condition Will get help right away if you are not doing well or get worse.  Your e-visit answers were reviewed by a board certified advanced clinical practitioner to complete your personal care plan.  Depending on the condition, your plan could have included both over the counter or prescription medications.  If there is a problem please reply  once you have received a response from your provider.  Your safety is important to [place].  If you have drug allergies check your prescription carefully.    You can use MyChart to ask questions about today's visit, request a non-urgent call back, or ask for a work or school excuse for 24 hours related to this e-Visit. If it has been greater than 24 hours you will  need to follow up with your provider, or enter a new e-Visit to address those concerns.  You will get an e-mail in the next two days asking about your experience.  I hope that your e-visit has been valuable and will speed your recovery. Thank you for using e-visits.   I have spent 5 minutes in review of e-visit questionnaire, review and updating patient chart, medical decision making and response to patient.   Margaretann Loveless, PA-C

## 2022-03-28 ENCOUNTER — Other Ambulatory Visit (HOSPITAL_COMMUNITY): Payer: Self-pay

## 2022-03-28 MED ORDER — AMPHETAMINE-DEXTROAMPHETAMINE 20 MG PO TABS
20.0000 mg | ORAL_TABLET | Freq: Three times a day (TID) | ORAL | 0 refills | Status: DC
  Filled 2022-03-28: qty 90, 30d supply, fill #0

## 2022-05-05 ENCOUNTER — Other Ambulatory Visit (HOSPITAL_COMMUNITY): Payer: Self-pay

## 2022-05-05 MED ORDER — AMPHETAMINE-DEXTROAMPHETAMINE 20 MG PO TABS
20.0000 mg | ORAL_TABLET | Freq: Three times a day (TID) | ORAL | 0 refills | Status: DC
  Filled 2022-05-05: qty 90, 30d supply, fill #0

## 2022-05-05 MED ORDER — LORAZEPAM 1 MG PO TABS
1.0000 mg | ORAL_TABLET | Freq: Three times a day (TID) | ORAL | 0 refills | Status: DC | PRN
  Filled 2022-05-05: qty 180, 30d supply, fill #0
  Filled 2022-06-06: qty 180, 30d supply, fill #1
  Filled 2022-08-17: qty 180, 30d supply, fill #2

## 2022-05-05 MED ORDER — ZOLPIDEM TARTRATE 10 MG PO TABS
10.0000 mg | ORAL_TABLET | Freq: Every day | ORAL | 0 refills | Status: DC
  Filled 2022-05-05: qty 30, 30d supply, fill #0
  Filled 2022-06-06: qty 30, 30d supply, fill #1
  Filled 2022-08-17: qty 30, 30d supply, fill #2

## 2022-06-06 ENCOUNTER — Other Ambulatory Visit (HOSPITAL_COMMUNITY): Payer: Self-pay

## 2022-06-06 ENCOUNTER — Other Ambulatory Visit: Payer: Self-pay

## 2022-06-06 MED ORDER — AMPHETAMINE-DEXTROAMPHETAMINE 20 MG PO TABS
20.0000 mg | ORAL_TABLET | Freq: Three times a day (TID) | ORAL | 0 refills | Status: DC
  Filled 2022-06-06: qty 90, 30d supply, fill #0

## 2022-07-07 ENCOUNTER — Other Ambulatory Visit (HOSPITAL_COMMUNITY): Payer: Self-pay

## 2022-08-11 ENCOUNTER — Telehealth: Payer: BC Managed Care – PPO | Admitting: Emergency Medicine

## 2022-08-11 DIAGNOSIS — B370 Candidal stomatitis: Secondary | ICD-10-CM | POA: Diagnosis not present

## 2022-08-11 MED ORDER — NYSTATIN 100000 UNIT/ML MT SUSP: 5.0000 mL | Freq: Four times a day (QID) | OROMUCOSAL | 0 refills | Status: AC

## 2022-08-11 NOTE — Progress Notes (Signed)
E Visit for Rash  We are sorry that you are not feeling well. Here is how we plan to help!  I've sent an RX for Nystatin swish and swallow for thrush treatment.  Use as directed.  If you don't have resolution, let us know.  GET HELP RIGHT AWAY IF:  Symptoms don't go away after treatment. Severe itching that persists. If you rash spreads or swells. If you rash begins to smell. If it blisters and opens or develops a yellow-brown crust. You develop a fever. You have a sore throat. You become short of breath.  MAKE SURE YOU:  Understand these instructions. Will watch your condition. Will get help right away if you are not doing well or get worse.  Thank you for choosing an e-visit.  Your e-visit answers were reviewed by a board certified advanced clinical practitioner to complete your personal care plan. Depending upon the condition, your plan could have included both over the counter or prescription medications.  Please review your pharmacy choice. Make sure the pharmacy is open so you can pick up prescription now. If there is a problem, you may contact your provider through [employer] and have the prescription routed to another pharmacy.  Your safety is important to [place]. If you have drug allergies check your prescription carefully.   For the next 24 hours you can use MyChart to ask questions about today's visit, request a non-urgent call back, or ask for a work or school excuse. You will get an email in the next two days asking about your experience. I hope that your e-visit has been valuable and will speed your recovery.  Approximately 5 minutes was used in reviewing the patient's chart, questionnaire, prescribing medications, and documentation.

## 2022-08-17 ENCOUNTER — Other Ambulatory Visit: Payer: Self-pay

## 2022-08-17 ENCOUNTER — Other Ambulatory Visit (HOSPITAL_COMMUNITY): Payer: Self-pay

## 2022-08-17 MED ORDER — AMPHETAMINE-DEXTROAMPHETAMINE 20 MG PO TABS
20.0000 mg | ORAL_TABLET | Freq: Every day | ORAL | 0 refills | Status: DC
  Filled 2022-08-17: qty 30, 30d supply, fill #0

## 2022-08-17 MED ORDER — AMPHETAMINE-DEXTROAMPHET ER 20 MG PO CP24
40.0000 mg | ORAL_CAPSULE | Freq: Every morning | ORAL | 0 refills | Status: DC
  Filled 2022-08-17: qty 60, 30d supply, fill #0

## 2022-08-18 ENCOUNTER — Other Ambulatory Visit (HOSPITAL_COMMUNITY): Payer: Self-pay

## 2022-09-19 ENCOUNTER — Other Ambulatory Visit (HOSPITAL_COMMUNITY): Payer: Self-pay

## 2022-09-19 MED ORDER — AMPHETAMINE-DEXTROAMPHET ER 20 MG PO CP24
40.0000 mg | ORAL_CAPSULE | Freq: Every morning | ORAL | 0 refills | Status: DC
  Filled 2022-09-19: qty 60, 30d supply, fill #0

## 2022-09-19 MED ORDER — LORAZEPAM 1 MG PO TABS
1.0000 mg | ORAL_TABLET | Freq: Three times a day (TID) | ORAL | 0 refills | Status: AC | PRN
  Filled 2022-09-19: qty 180, 30d supply, fill #0
  Filled 2022-11-03: qty 180, 30d supply, fill #1
  Filled 2023-01-12: qty 180, 30d supply, fill #2

## 2022-09-19 MED ORDER — AMPHETAMINE-DEXTROAMPHETAMINE 20 MG PO TABS
20.0000 mg | ORAL_TABLET | Freq: Every day | ORAL | 0 refills | Status: DC
  Filled 2022-09-19: qty 30, 30d supply, fill #0

## 2022-09-19 MED ORDER — ZOLPIDEM TARTRATE 10 MG PO TABS
10.0000 mg | ORAL_TABLET | Freq: Every day | ORAL | 0 refills | Status: AC
  Filled 2022-09-19: qty 30, 30d supply, fill #0
  Filled 2022-11-03: qty 30, 30d supply, fill #1
  Filled 2023-01-12 (×2): qty 30, 30d supply, fill #2

## 2022-09-22 ENCOUNTER — Ambulatory Visit: Payer: BC Managed Care – PPO | Admitting: Family

## 2022-09-29 ENCOUNTER — Other Ambulatory Visit (HOSPITAL_COMMUNITY): Payer: Self-pay

## 2022-11-03 ENCOUNTER — Other Ambulatory Visit (HOSPITAL_COMMUNITY): Payer: Self-pay

## 2022-11-03 MED ORDER — AMPHETAMINE-DEXTROAMPHETAMINE 20 MG PO TABS
20.0000 mg | ORAL_TABLET | Freq: Every day | ORAL | 0 refills | Status: DC
  Filled 2022-11-03: qty 30, 30d supply, fill #0

## 2022-11-03 MED ORDER — AMPHETAMINE-DEXTROAMPHET ER 20 MG PO CP24
40.0000 mg | ORAL_CAPSULE | Freq: Every morning | ORAL | 0 refills | Status: DC
  Filled 2022-11-03: qty 60, 30d supply, fill #0

## 2022-11-06 ENCOUNTER — Other Ambulatory Visit: Payer: Self-pay

## 2022-12-08 ENCOUNTER — Other Ambulatory Visit (HOSPITAL_COMMUNITY): Payer: Self-pay

## 2022-12-08 MED ORDER — AMPHETAMINE-DEXTROAMPHETAMINE 20 MG PO TABS
20.0000 mg | ORAL_TABLET | Freq: Every day | ORAL | 0 refills | Status: AC
  Filled 2022-12-08 – 2023-01-12 (×2): qty 30, 30d supply, fill #0

## 2022-12-08 MED ORDER — AMPHETAMINE-DEXTROAMPHET ER 20 MG PO CP24
40.0000 mg | ORAL_CAPSULE | Freq: Every morning | ORAL | 0 refills | Status: AC
  Filled 2022-12-08 – 2023-01-12 (×2): qty 60, 30d supply, fill #0

## 2022-12-12 ENCOUNTER — Other Ambulatory Visit (HOSPITAL_COMMUNITY): Payer: Self-pay

## 2022-12-21 ENCOUNTER — Other Ambulatory Visit (HOSPITAL_COMMUNITY): Payer: Self-pay

## 2022-12-25 ENCOUNTER — Other Ambulatory Visit (HOSPITAL_COMMUNITY): Payer: Self-pay

## 2023-01-12 ENCOUNTER — Other Ambulatory Visit: Payer: Self-pay

## 2023-01-12 ENCOUNTER — Other Ambulatory Visit (HOSPITAL_COMMUNITY): Payer: Self-pay

## 2023-01-16 ENCOUNTER — Other Ambulatory Visit: Payer: Self-pay

## 2023-01-16 ENCOUNTER — Other Ambulatory Visit (HOSPITAL_COMMUNITY): Payer: Self-pay

## 2023-01-17 ENCOUNTER — Other Ambulatory Visit (HOSPITAL_COMMUNITY): Payer: Self-pay

## 2023-04-17 ENCOUNTER — Other Ambulatory Visit (HOSPITAL_BASED_OUTPATIENT_CLINIC_OR_DEPARTMENT_OTHER): Payer: Self-pay

## 2024-02-06 ENCOUNTER — Other Ambulatory Visit: Payer: Self-pay | Admitting: Medical Genetics

## 2024-05-27 ENCOUNTER — Other Ambulatory Visit: Payer: Self-pay | Admitting: Medical Genetics

## 2024-05-27 DIAGNOSIS — Z006 Encounter for examination for normal comparison and control in clinical research program: Secondary | ICD-10-CM
